# Patient Record
Sex: Male | Born: 1970 | Race: White | Hispanic: No | Marital: Married | Smoking: Never smoker
Health system: Southern US, Community
[De-identification: ages and names within clinical notes are randomized; demographics above are authoritative.]

## PROBLEM LIST (undated history)

## (undated) DIAGNOSIS — K219 Gastro-esophageal reflux disease without esophagitis: Secondary | ICD-10-CM

## (undated) DIAGNOSIS — K8020 Calculus of gallbladder without cholecystitis without obstruction: Secondary | ICD-10-CM

## (undated) DIAGNOSIS — F32A Depression, unspecified: Secondary | ICD-10-CM

## (undated) DIAGNOSIS — F329 Major depressive disorder, single episode, unspecified: Secondary | ICD-10-CM

## (undated) DIAGNOSIS — F419 Anxiety disorder, unspecified: Secondary | ICD-10-CM

## (undated) DIAGNOSIS — T7840XA Allergy, unspecified, initial encounter: Secondary | ICD-10-CM

## (undated) HISTORY — DX: Gastro-esophageal reflux disease without esophagitis: K21.9

## (undated) HISTORY — PX: OTHER SURGICAL HISTORY: SHX169

## (undated) HISTORY — DX: Allergy, unspecified, initial encounter: T78.40XA

## (undated) HISTORY — PX: WISDOM TOOTH EXTRACTION: SHX21

---

## 2013-05-19 ENCOUNTER — Emergency Department (HOSPITAL_BASED_OUTPATIENT_CLINIC_OR_DEPARTMENT_OTHER)
Admission: EM | Admit: 2013-05-19 | Discharge: 2013-05-20 | Disposition: A | Discharge status: Home / Self Care | Payer: BC Managed Care – PPO | Attending: Emergency Medicine | Admitting: Emergency Medicine

## 2013-05-19 ENCOUNTER — Encounter (HOSPITAL_BASED_OUTPATIENT_CLINIC_OR_DEPARTMENT_OTHER): Payer: Self-pay | Admitting: Emergency Medicine

## 2013-05-19 DIAGNOSIS — K8020 Calculus of gallbladder without cholecystitis without obstruction: Secondary | ICD-10-CM

## 2013-05-19 DIAGNOSIS — K8070 Calculus of gallbladder and bile duct without cholecystitis without obstruction: Secondary | ICD-10-CM | POA: Insufficient documentation

## 2013-05-19 DIAGNOSIS — F3289 Other specified depressive episodes: Secondary | ICD-10-CM | POA: Insufficient documentation

## 2013-05-19 DIAGNOSIS — Z880 Allergy status to penicillin: Secondary | ICD-10-CM | POA: Insufficient documentation

## 2013-05-19 DIAGNOSIS — Z79899 Other long term (current) drug therapy: Secondary | ICD-10-CM | POA: Insufficient documentation

## 2013-05-19 DIAGNOSIS — F411 Generalized anxiety disorder: Secondary | ICD-10-CM | POA: Insufficient documentation

## 2013-05-19 DIAGNOSIS — R1033 Periumbilical pain: Secondary | ICD-10-CM | POA: Insufficient documentation

## 2013-05-19 DIAGNOSIS — F329 Major depressive disorder, single episode, unspecified: Secondary | ICD-10-CM | POA: Insufficient documentation

## 2013-05-19 HISTORY — DX: Major depressive disorder, single episode, unspecified: F32.9

## 2013-05-19 HISTORY — DX: Anxiety disorder, unspecified: F41.9

## 2013-05-19 HISTORY — DX: Depression, unspecified: F32.A

## 2013-05-19 LAB — URINALYSIS, ROUTINE W REFLEX MICROSCOPIC
BILIRUBIN URINE: NEGATIVE
GLUCOSE, UA: NEGATIVE mg/dL
Hgb urine dipstick: NEGATIVE
KETONES UR: NEGATIVE mg/dL
LEUKOCYTES UA: NEGATIVE
Nitrite: NEGATIVE
PROTEIN: NEGATIVE mg/dL
Specific Gravity, Urine: 1.016 (ref 1.005–1.030)
Urobilinogen, UA: 0.2 mg/dL (ref 0.0–1.0)
pH: 8 (ref 5.0–8.0)

## 2013-05-19 MED ORDER — ONDANSETRON HCL 4 MG/2ML IJ SOLN
4.0000 mg | Freq: Once | INTRAMUSCULAR | Status: AC
  Administered 2013-05-19: 4 mg via INTRAVENOUS
  Filled 2013-05-19: qty 2

## 2013-05-19 MED ORDER — SODIUM CHLORIDE 0.9 % IV BOLUS (SEPSIS)
1000.0000 mL | Freq: Once | INTRAVENOUS | Status: AC
  Administered 2013-05-19: 1000 mL via INTRAVENOUS

## 2013-05-19 MED ORDER — FENTANYL CITRATE 0.05 MG/ML IJ SOLN
50.0000 ug | Freq: Once | INTRAMUSCULAR | Status: AC
  Administered 2013-05-19: 50 ug via INTRAVENOUS
  Filled 2013-05-19: qty 2

## 2013-05-19 NOTE — ED Notes (Signed)
RLQ pain with vomiting x2 hours.

## 2013-05-19 NOTE — ED Notes (Signed)
Pt reports doesn't have regular bowel movements. PT reports blood in stool x 1 month but just associated it with hemorrhoids.  Pt complains of nausea and severe right lower quad pain.  Pt reports feeling as though his stomach is swollen.

## 2013-05-19 NOTE — ED Notes (Signed)
Rt abd pain  Sudden onset 2 hrs ago w vomiting  Also states urine has been cloudy for some time, bright red blood in stools

## 2013-05-20 ENCOUNTER — Emergency Department (HOSPITAL_BASED_OUTPATIENT_CLINIC_OR_DEPARTMENT_OTHER): Payer: BC Managed Care – PPO

## 2013-05-20 LAB — COMPREHENSIVE METABOLIC PANEL
ALBUMIN: 4.2 g/dL (ref 3.5–5.2)
ALK PHOS: 79 U/L (ref 39–117)
ALT: 20 U/L (ref 0–53)
AST: 20 U/L (ref 0–37)
BUN: 14 mg/dL (ref 6–23)
CALCIUM: 10.3 mg/dL (ref 8.4–10.5)
CO2: 25 mEq/L (ref 19–32)
CREATININE: 1.2 mg/dL (ref 0.50–1.35)
Chloride: 99 mEq/L (ref 96–112)
GFR calc non Af Amer: 74 mL/min — ABNORMAL LOW (ref >90)
GFR, EST AFRICAN AMERICAN: 85 mL/min — AB (ref >90)
Glucose, Bld: 108 mg/dL — ABNORMAL HIGH (ref 70–99)
Potassium: 3.6 mEq/L — ABNORMAL LOW (ref 3.7–5.3)
Sodium: 140 mEq/L (ref 137–147)
TOTAL PROTEIN: 7.8 g/dL (ref 6.0–8.3)
Total Bilirubin: 0.7 mg/dL (ref 0.3–1.2)

## 2013-05-20 LAB — CBC
HEMATOCRIT: 44.2 % (ref 39.0–52.0)
Hemoglobin: 15.8 g/dL (ref 13.0–17.0)
MCH: 31.1 pg (ref 26.0–34.0)
MCHC: 35.7 g/dL (ref 30.0–36.0)
MCV: 87 fL (ref 78.0–100.0)
Platelets: 315 10*3/uL (ref 150–400)
RBC: 5.08 MIL/uL (ref 4.22–5.81)
RDW: 11.8 % (ref 11.5–15.5)
WBC: 9.4 10*3/uL (ref 4.0–10.5)

## 2013-05-20 LAB — OCCULT BLOOD X 1 CARD TO LAB, STOOL: FECAL OCCULT BLD: NEGATIVE

## 2013-05-20 MED ORDER — ONDANSETRON HCL 4 MG/2ML IJ SOLN
4.0000 mg | Freq: Once | INTRAMUSCULAR | Status: AC
  Administered 2013-05-20: 4 mg via INTRAVENOUS
  Filled 2013-05-20: qty 2

## 2013-05-20 MED ORDER — HYDROMORPHONE HCL PF 1 MG/ML IJ SOLN
1.0000 mg | Freq: Once | INTRAMUSCULAR | Status: AC
  Administered 2013-05-20: 1 mg via INTRAVENOUS
  Filled 2013-05-20: qty 1

## 2013-05-20 MED ORDER — ONDANSETRON HCL 4 MG PO TABS: 4.0000 mg | ORAL_TABLET | Freq: Four times a day (QID) | ORAL | Status: DC

## 2013-05-20 MED ORDER — SODIUM CHLORIDE 0.9 % IV BOLUS (SEPSIS)
1000.0000 mL | Freq: Once | INTRAVENOUS | Status: AC
  Administered 2013-05-20: 1000 mL via INTRAVENOUS

## 2013-05-20 MED ORDER — IOHEXOL 300 MG/ML  SOLN
100.0000 mL | Freq: Once | INTRAMUSCULAR | Status: AC | PRN
  Administered 2013-05-20: 100 mL via INTRAVENOUS

## 2013-05-20 MED ORDER — IOHEXOL 300 MG/ML  SOLN
50.0000 mL | Freq: Once | INTRAMUSCULAR | Status: AC | PRN
  Administered 2013-05-20: 50 mL via ORAL

## 2013-05-20 MED ORDER — OXYCODONE-ACETAMINOPHEN 5-325 MG PO TABS: 2.0000 | ORAL_TABLET | ORAL | Status: DC | PRN

## 2013-05-20 NOTE — ED Provider Notes (Signed)
CSN: [id number]     Arrival date & time 05/19/13  2310 History   First MD Initiated Contact with Patient 05/20/13 0001     Chief Complaint  Patient presents with  . Abdominal Pain     (Consider location/radiation/quality/duration/timing/severity/associated sxs/prior Treatment) HPI 43 year old male presents to emergency room with onset of right upper quadrant and periumbilical pain starting at 9 PM.  Pain has been severe, sharp and unrelenting.  He has had nausea and vomiting associate with pain.  Number history of similar pain.  No prior abdominal surgeries.  Patient reports he has had bloody bowel movements.  Over the last month.  Patient seemed he had a hemorrhoid that was causing the bleeding.  Patient also reports over the last several weeks to months.  He has had dysuria, urinary frequency.  No fevers no chills Past Medical History  Diagnosis Date  . Depression   . Anxiety    History reviewed. No pertinent past surgical history. No family history on file. History  Substance Use Topics  . Smoking status: Never Smoker   . Smokeless tobacco: Not on file  . Alcohol Use: No    Review of Systems   See History of Present Illness; otherwise all other systems are reviewed and negative  Allergies  Amoxicillin and Penicillins  Home Medications   Current Outpatient Rx  Name  Route  Sig  Dispense  Refill  . amphetamine-dextroamphetamine (ADDERALL) 20 MG tablet   Oral   Take 20 mg by mouth daily.         . busPIRone (BUSPAR) 10 MG tablet   Oral   Take 10 mg by mouth 3 (three) times daily.         Marland Kitchen FLUoxetine (PROZAC) 20 MG tablet   Oral   Take 20 mg by mouth daily.          BP 159/94  Pulse 82  Temp(Src) 98.7 F (37.1 C) (Oral)  Resp 20  Ht 6' (1.829 m)  Wt 172 lb (78.019 kg)  BMI 23.32 kg/m2  SpO2 100% Physical Exam  Nursing note and vitals reviewed. Constitutional: He is oriented to person, place, and time. He appears well-developed and well-nourished. He  appears distressed.  HENT:  Head: Normocephalic and atraumatic.  Nose: Nose normal.  Mouth/Throat: Oropharynx is clear and moist.  Eyes: Conjunctivae and EOM are normal. Pupils are equal, round, and reactive to light.  Neck: Normal range of motion. Neck supple. No JVD present. No tracheal deviation present. No thyromegaly present.  Cardiovascular: Normal rate, regular rhythm, normal heart sounds and intact distal pulses.  Exam reveals no gallop and no friction rub.   No murmur heard. Pulmonary/Chest: Effort normal and breath sounds normal. No stridor. No respiratory distress. He has no wheezes. He has no rales. He exhibits no tenderness.  Abdominal: Soft. Bowel sounds are normal. He exhibits no distension and no mass. There is tenderness (patient with tenderness in right upper quadrant, moderate Murphy sign). There is no rebound and no guarding.  Genitourinary:  Patient has enlarged tender prostate on exam.  No hemorrhoids noted  Musculoskeletal: Normal range of motion. He exhibits no edema and no tenderness.  Lymphadenopathy:    He has no cervical adenopathy.  Neurological: He is alert and oriented to person, place, and time. He exhibits normal muscle tone. Coordination normal.  Skin: Skin is warm and dry. No rash noted. No erythema. No pallor.  Psychiatric: He has a normal mood and affect. His behavior is normal. Judgment  and thought content normal.    ED Course  Procedures (including critical care time) Labs Review Labs Reviewed  URINALYSIS, ROUTINE W REFLEX MICROSCOPIC - Abnormal; Notable for the following:    APPearance CLOUDY (*)    All other components within normal limits  COMPREHENSIVE METABOLIC PANEL - Abnormal; Notable for the following:    Potassium 3.6 (*)    Glucose, Bld 108 (*)    GFR calc non Af Amer 74 (*)    GFR calc Af Amer 85 (*)    All other components within normal limits  CBC  OCCULT BLOOD X 1 CARD TO LAB, STOOL   Imaging Review Ct Abdomen Pelvis W  Contrast  05/20/2013   CLINICAL DATA:  Epigastric and right upper quadrant abdominal pain.  EXAM: CT ABDOMEN AND PELVIS WITH CONTRAST  TECHNIQUE: Multidetector CT imaging of the abdomen and pelvis was performed using the standard protocol following bolus administration of intravenous contrast.  CONTRAST:  50mL OMNIPAQUE IOHEXOL 300 MG/ML SOLN, 100mL OMNIPAQUE IOHEXOL 300 MG/ML SOLN  COMPARISON:  No priors.  FINDINGS: Lung Bases: Unremarkable.  Abdomen/Pelvis: The appearance of the liver, pancreas, spleen, bilateral adrenal glands and bilateral kidneys is unremarkable. Several tiny high attenuation foci lying dependently in the gallbladder compatible with partially calcified gallstones. No current findings to suggest acute cholecystitis at this time. Normal appendix. No significant volume of ascites. No pneumoperitoneum. No pathologic distention of small bowel. No definite lymphadenopathy identified within the abdomen or pelvis on today's examination. Prostate gland and urinary bladder are unremarkable in appearance. Status post vasectomy.  Musculoskeletal: There are no aggressive appearing lytic or blastic lesions noted in the visualized portions of the skeleton.  IMPRESSION: 1. No acute findings in the abdomen or pelvis to account for the patient's symptoms. 2. Normal appendix. 3. Cholelithiasis without evidence to suggest acute cholecystitis at this time.   Electronically Signed   By: Trudie Reedaniel  Entrikin M.D.   On: 05/20/2013 02:14     EKG Interpretation None      MDM   Final diagnoses:  Cholelithiasis    43 year old male with right upper oral pain concerning for gallbladder pathology.  Patient also with odd history of blood in stool for the last month that he has not sought care for also with some dysuria.  On exam, he has a enlarged prostate with tenderness, may have an element of prostatitis ongoing.  Occult blood stool, negative.  Plan for CT abdomen pelvis with contrast to further evaluate for  colitis, cholecystitis, cholelithiasis.    Olivia Mackielga M Elspeth Blucher, MD 05/20/13 [phone]

## 2013-05-20 NOTE — Discharge Instructions (Signed)
Biliary Colic  °Biliary colic is a steady or irregular pain in the upper abdomen. It is usually under the right side of the rib cage. It happens when gallstones interfere with the normal flow of bile from the gallbladder. Bile is a liquid that helps to digest fats. Bile is made in the liver and stored in the gallbladder. When you eat a meal, bile passes from the gallbladder through the cystic duct and the common bile duct into the small intestine. There, it mixes with partially digested food. If a gallstone blocks either of these ducts, the normal flow of bile is blocked. The muscle cells in the bile duct contract forcefully to try to move the stone. This causes the pain of biliary colic.  °SYMPTOMS  °· A person with biliary colic usually complains of pain in the upper abdomen. This pain can be: °· In the center of the upper abdomen just below the breastbone. °· In the upper-right part of the abdomen, near the gallbladder and liver. °· Spread back toward the right shoulder blade. °· Nausea and vomiting. °· The pain usually occurs after eating. °· Biliary colic is usually triggered by the digestive system's demand for bile. The demand for bile is high after fatty meals. Symptoms can also occur when a person who has been fasting suddenly eats a very large meal. Most episodes of biliary colic pass after 1 to 5 hours. After the most intense pain passes, your abdomen may continue to ache mildly for about 24 hours. °DIAGNOSIS  °After you describe your symptoms, your caregiver will perform a physical exam. He or she will pay attention to the upper right portion of your belly (abdomen). This is the area of your liver and gallbladder. An ultrasound will help your caregiver look for gallstones. Specialized scans of the gallbladder may also be done. Blood tests may be done, especially if you have fever or if your pain persists. °PREVENTION  °Biliary colic can be prevented by controlling the risk factors for gallstones. Some of  these risk factors, such as heredity, increasing age, and pregnancy are a normal part of life. Obesity and a high-fat diet are risk factors you can change through a healthy lifestyle. Women going through menopause who take hormone replacement therapy (estrogen) are also more likely to develop biliary colic. °TREATMENT  °· Pain medication may be prescribed. °· You may be encouraged to eat a fat-free diet. °· If the first episode of biliary colic is severe, or episodes of colic keep retuning, surgery to remove the gallbladder (cholecystectomy) is usually recommended. This procedure can be done through small incisions using an instrument called a laparoscope. The procedure often requires a brief stay in the hospital. Some people can leave the hospital the same day. It is the most widely used treatment in people troubled by painful gallstones. It is effective and safe, with no complications in more than 90% of cases. °· If surgery cannot be done, medication that dissolves gallstones may be used. This medication is expensive and can take months or years to work. Only small stones will dissolve. °· Rarely, medication to dissolve gallstones is combined with a procedure called shock-wave lithotripsy. This procedure uses carefully aimed shock waves to break up gallstones. In many people treated with this procedure, gallstones form again within a few years. °PROGNOSIS  °If gallstones block your cystic duct or common bile duct, you are at risk for repeated episodes of biliary colic. There is also a 25% chance that you will develop   a gallbladder infection(acute cholecystitis), or some other complication of gallstones within 10 to 20 years. If you have surgery, schedule it at a time that is convenient for you and at a time when you are not sick. HOME CARE INSTRUCTIONS   Drink plenty of clear fluids.  Avoid fatty, greasy or fried foods, or any foods that make your pain worse.  Take medications as directed. SEEK MEDICAL  CARE IF:   You develop a fever over 100.5 F (38.1 C).  Your pain gets worse over time.  You develop nausea that prevents you from eating and drinking.  You develop vomiting. SEEK IMMEDIATE MEDICAL CARE IF:   You have continuous or severe belly (abdominal) pain which is not relieved with medications.  You develop nausea and vomiting which is not relieved with medications.  You have symptoms of biliary colic and you suddenly develop a fever and shaking chills. This may signal cholecystitis. Call your caregiver immediately.  You develop a yellow color to your skin or the white part of your eyes (jaundice). Document Released: 07/15/2005 Document Revised: 05/06/2011 Document Reviewed: 09/24/2007 [facility] Patient Information 2014 [place], [place].   Gallbladder  Take medications as prescribed.  Call the surgery clinic later today to schedule a follow up visit for your gallstones.  Return to the ER for worsening pain, vomiting despite medications, fever, or other new concerning symptoms.   PAIN ACETAMINOPHEN OXYCODONE  PAIN ACETAMINOPHEN OXYCODONE: You have been given a medication that contains acetaminophen and oxycodone.      This medication is used to relieve pain.     DO NOT take this medication if you have liver disease or drink alcohol on a daily basis.     DO NOT take this medication if you are taking other over-the-counter medications that contain Tylenol or acetaminophen (the active ingredient in Tylenol).     If you have side-effects that you think are caused by this medicine, tell your doctor.     DO NOT drink alcoholic beverages while taking this medicine.     If you become dizzy, sit or lie down at the first signs.  You should be careful going up and down stairs.     If you are pregnant or breastfeeding, notify your doctor before taking this medication.     Keep this medication out of the reach of children.  Always keep this medication in child-proof containers.   DO NOT give your medication to anyone else. This medication can be HABIT-FORMING.  Discontinue use when no longer needed and never give this medication to others.  You have been given a medication, or a prescription for a medication, that causes drowsiness or dizziness.  DO NOT drive a car, operate machinery, or perform jobs that require you to be alert until you know how you are going to react to this medicine.  THESE INSTRUCTIONS ARE NOT COMPREHENSIVE (complete):  Ask your pharmacist for additional information and precautions for this medication.   GI ANTIEMETIC  GI ANTIEMETIC: You have been given a prescription for a medication for nausea and vomiting.      It is OK to take this medication if you are pregnant.  Be sure to tell your regular doctor or obstetrician [facility] doctor) that you have been taking this medication.     Take this medication as directed.     If you are taking phenobarbital, narcotic pain medications, antidepressants, or sleeping pills your dosage may need to be adjusted.  Be sure to inform your doctor  of all the other medications that you are taking.     DO NOT take this medication if you have liver disease or heart disease.     DO NOT take pain killers (narcotic medication) unless specifically instructed to do so by your doctor     DO NOT drink alcoholic beverages while taking this medicine.     If you develop any reactions that you believe may be from the medication be sure to tell your doctor or return to the ER (Some reactions may include:  dizziness, shaking, visual disturbances, nervousness, fainting, rash).     If you become dizzy, sit or lie down at the first signs.  You should be careful going up and down stairs.     Keep this medication out of the reach of children.  Always keep this medication in child-proof containers.  DO NOT give your medication to anyone else. You have been given a medication, or a prescription for a medication, that causes drowsiness  or dizziness.  DO NOT drive a car, operate machinery, ride a bike, or perform jobs that require you to be alert until you know how you are going to react to this medication.  THESE INSTRUCTIONS ARE NOT COMPREHENSIVE (complete):  Ask your pharmacist for additional information and precautions for this medication.   LOW FAT DIET  Low Fat Diet  Your doctor wants you to be on a low fat diet.  This diet will be helpful if you want to lose weight or if you have problems with your liver, pancreas, gallbladder.  FOODS ALLOWED:    Beverages: All, except those not allowed.   Evaporated skim milk.     Breads: All enriched or whole grain bread, bread sticks, graham crackers, melba toast, pretzels, rye wafers, matzoh, saltines, bagels.     Cereals: All cooked without fat or dry.     Desserts: All fruit, diet puddings, gelatin, dessert made with egg white, angel food cake, fruit ice, sherbet.     Eggs: Not more than one egg yolk daily, whites okay.  Cholesterol free egg substitutes, such as "egg beaters."     Fat: One teaspoon each meal or 3 teaspoons per day; butter, mayonnaise, margarine, oil. (May be used in cooking if omitted at meals) Fat free salad dressings and gravy.     Fruits: All fresh, frozen, or canned fruit or fruit juice. One citrus fruit every day.     Meats, Fish, Poultry & Cheese: Remove visible fat from meat before cooking. Baked, broiled, boiled, roasted, stewed, simmered; lean fish, meat, poultry, seafood.  Water packed salmon and tuna.  Lowfat cottage cheese, skim milk cheese, ricotta, parmesan, farmer's cheese, lowfat yogurt and tofu.     Potatoes & Substitutes: Macaroni, noodles, rice, spaghetti, sweet or white potato; prepared without fat, unless used in amount allowed.     Soups: Bouillon, cream soups made with vegetables and skim milk, fat free meat and poultry soups.     Sweets: Honey, jam, jelly, marshmallows, molasses, sugar, syrup, candies; hard, Life Savers, gum  drops, jelly beans, sour balls.     Vegetables: All fresh, frozen, canned or juiced vegetables allowed.  FOODS NOT ALLOWED:    Beverages: Cream, 2% or whole milk, chocolate milk, condensed milk, evaporated or malted milk or shakes, 1/2 & 1/2.     Breads: Quick breads, muffins, biscuits, pancakes, corn bread, sweet rolls, any fried breads.     Cereals: Bran, if it causes distress, wheat germ.  Desserts: Dessert made with whole milk, cream, butter, lard, oil, coconut, nuts, or chocolate.     Eggs: Eggs prepared with whole milk or fat. Fried eggs.     Fat: More than 1 teaspoon per meal.  Bacon, bacon fat, ham fat, lard, salt pork, shortening, gravy, salad dressings, non-dairy creamers.     Fruits: [place].  Any fruit that causes distress.     Meats, Fish, [employer] forces [employer] and [employer]: All fried or fatty.  Sausage, prime rib, frankfurters, luncheon meats, fish canned in oil, duck, goose, poultry skin, spiced or pickled meats, cheese (except those allowed), whole milk yogurt.  Peanut butter limited to 1 Tbs. day.     Potatoes & Substitutes: Cooked with fat or oil, fried potatoes, potato chips, cream sauces (unless made with skim milk).     Sweets: Chocolate, coconut, nuts, caramels.                Vegetables: Avocado, any cooked in fat or that cause distress.    BILIARY COLIC  BILIARY COLIC: You have been diagnosed with biliary colic.  Biliary colic is the term used to describe crampy pain from a gallbladder that contains gallstones.  The gallbladder is a small sack that hangs from the liver. It stores a liquid called bile. Bile is produced by the liver. When you eat, the gallbladder squeezes bile through a duct or tube into the intestines to help with the digestion of fat.  Gallstones develop from crystals of bile. The stone may be smaller than a pea, or as large as a golf ball. There may be one or several stones. The stone may block the tube that drains the bile from the gallbladder. This  blockage causes spasm of the gallbladder and pain.  The pain usually comes and goes and is crampy.  It often starts after eating foods that contain a lot of fat.  You may also have nausea and vomiting with the pain.  Biliary colic is usually treated with pain medications. You may also be given a medication for the nausea and vomiting.  You should avoid eating foods that are fried or contain a lot of fat.  If you have more episodes of pain, you may need to have your gall bladder removed.      You should contact your family doctor for a referral to a general surgeon. YOU SHOULD SEEK MEDICAL ATTENTION IMMEDIATELY, EITHER HERE OR AT THE NEAREST EMERGENCY DEPARTMENT, IF ANY OF THE FOLLOWING OCCURS:      Increasing pain or pain that does not go away.     Persistent vomiting or if you are not able to keep any fluids down.     Fever or shaking chills.     Yellowing of your skin or eyes, or dark, brown-colored urine.  If you develop symptoms of Shortness of Breath, Chest Pain, Swelling of lips, mouth or tongue or if your condition becomes worse with any new symptoms, see your doctor or return to the Emergency Department for immediate care. Emergency services are not intended to be a substitute for comprehensive medical attention.  Please contact your doctor for follow up if not improving as expected.   Call your doctor in 5-7 days or as directed if there is no improvement.   Community Resources: *IF YOU ARE IN IMMEDIATE DANGER CALL 911!  Abuse/Neglect:  Family Services Crisis Hotline [facility]): 5855581581 [facility]): 313-468-0640  After hours, holidays and weekends: 901 278 9247 National Domestic Violence  Hotline: 800 811-9147  Mental Health: [facility]: Drucie Ip: [phone]  Health Clinics:  [facility]): [phone] Monday - Friday 8 AM - 9 PM, Saturday and Sunday 10 AM - 9  PM  [facility] [place]: [phone] Monday - Friday 8 AM - 5 PM  [facility]  [address]: [phone] Monday- Friday 8:30 AM - 5:30 PM, Sat 9 AM - 1 PM  [facility] [address]: [phone] [facility] [address]: [phone] [facility] [address]: [phone]  24 [facility]: [address] [phone]  Cultures: If culture results are positive, we will notify you if a change in treatment is necessary.  LABORATORY TESTS:         If you had any labs drawn in the ED that have not resulted by the time you are discharged home, we will review these lab results and the treatment given to you.  If there is any further treatment or notification needed, we will contact you by phone, or letter.  "PLEASE ENSURE THAT YOU HAVE GIVEN US YOUR CURRENT WORKING PHONE NUMBER AND YOUR CURRENT ADDRESS, so that we can contact you if needed."  RADIOLOGY TESTS:  If the referred physician wants todays x-rays, please call the hospitals Radiology Department the day before your doctors appointment. [facility]     [phone] [place]   [phone] [place]     [phone]  Our doctors and staff appreciate your choosing [place] for your emergency medical care needs. We are here to serve you.

## 2013-06-02 ENCOUNTER — Encounter (HOSPITAL_COMMUNITY): Payer: Self-pay | Admitting: *Deleted

## 2013-06-02 ENCOUNTER — Ambulatory Visit (INDEPENDENT_AMBULATORY_CARE_PROVIDER_SITE_OTHER): Payer: BC Managed Care – PPO | Admitting: Surgery

## 2013-06-02 ENCOUNTER — Encounter (INDEPENDENT_AMBULATORY_CARE_PROVIDER_SITE_OTHER): Payer: Self-pay | Admitting: Surgery

## 2013-06-02 VITALS — BP 128/74 | HR 75 | Temp 97.5°F | Ht 72.0 in | Wt 168.4 lb

## 2013-06-02 DIAGNOSIS — K8010 Calculus of gallbladder with chronic cholecystitis without obstruction: Secondary | ICD-10-CM | POA: Insufficient documentation

## 2013-06-02 NOTE — Progress Notes (Signed)
General Surgery [facility]  Chief Complaint  Patient presents with  . New Evaluation    evaluate for symptomatic cholelithiasis - referral from Dr. 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 Patient had his first episode of right upper quadrant abdominal pain associated with nausea and emesis in March 2015. He was seen in the emergency department. Laboratory studies were normal. CT scan of the abdomen and pelvis showed cholelithiasis. Patient has continued to have intermittent right upper quadrant abdominal pain, particularly with fatty food intake.  Patient denies any previous history of hepatobiliary or pancreatic disease. He denies jaundice or acholic stools. He has had no prior abdominal surgery.  Past Medical History  Diagnosis Date  . Depression   . Anxiety     Current Outpatient Prescriptions  Medication Sig Dispense Refill  . amphetamine-dextroamphetamine (ADDERALL) 20 MG tablet Take 20 mg by mouth daily.      . busPIRone (BUSPAR) 10 MG tablet Take 10 mg by mouth 3 (three) times daily.      Marland Kitchen FLUoxetine (PROZAC) 20 MG tablet Take 20 mg by mouth daily.      . ondansetron (ZOFRAN) 4 MG tablet Take 1 tablet (4 mg total) by mouth every 6 (six) hours.  20 tablet  0   No current facility-administered medications for this visit.    Allergies  Allergen Reactions  . Amoxicillin Rash  . Penicillins Rash    History reviewed. No pertinent family history.  History   Social History  . Marital Status: Married    Spouse Name: N/A    Number of Children: N/A  . Years of Education: N/A   Social History Main Topics  . Smoking status: Never Smoker   . Smokeless tobacco: None  . Alcohol Use: Yes  . Drug Use: No  . Sexual Activity: None   Other Topics Concern  . None   Social History Narrative  . None    REVIEW OF SYSTEMS - PERTINENT POSITIVES  ONLY: Denies jaundice. Denies acholic stools. Intermittent right upper quadrant abdominal pain. Intermittent nausea. Occasional emesis.  EXAM: Filed Vitals:   06/02/13 1004  BP: 128/74  Pulse: 75  Temp: 97.5 F (36.4 C)    GENERAL: well-developed, well-nourished, no acute distress HEENT: normocephalic; pupils equal and reactive; sclerae clear; dentition good; mucous membranes moist NECK:  No palpable masses in the thyroid bed; symmetric on extension; no palpable anterior or posterior cervical lymphadenopathy; no supraclavicular masses; no tenderness CHEST: clear to auscultation bilaterally without rales, rhonchi, or wheezes CARDIAC: regular rate and rhythm without significant murmur; peripheral pulses are full ABDOMEN: soft without distension; bowel sounds present; no mass; no hepatosplenomegaly; small umbilical hernia; mild tenderness to deep palpation right upper quadrant without palpable mass EXT:  non-tender without edema; no deformity NEURO: no gross focal deficits; no sign of tremor   LABORATORY RESULTS: See [facility] (CHL-Epic) for most recent results  RADIOLOGY RESULTS: See [facility] (CHL-Epic) for most recent results  IMPRESSION: Symptomatic cholelithiasis, probable chronic cholecystitis  PLAN: I discussed the above findings at length with the patient. We reviewed his CT scan results and his laboratory studies. I provided him with written literature regarding cholecystectomy.  We discussed laparoscopic cholecystectomy. We discussed the procedure. We discussed potential complications. We discussed intraoperative cholangiography. We discussed the hospital stay to be anticipated and is returned activities. Patient understands and wishes to proceed in the near future.  The risks and benefits of the procedure have been discussed at length with the patient.  The patient understands the proposed procedure, potential alternative treatments, and the course of recovery  to be expected.  All of the patient's questions have been answered at this time.  The patient wishes to proceed with surgery.  Velora Hecklerodd M. Tyjae Shvartsman, MD, FACS General & Endocrine Surgery [facility]  Primary Care Physician: No PCP Per Patient

## 2013-06-02 NOTE — Patient Instructions (Signed)
  [facility]  LAPAROSCOPIC SURGERY - POST-OP INSTRUCTIONS  Always review your discharge instruction sheet given to you by the facility where your surgery was performed.  A prescription for pain medication may be given to you upon discharge.  Take your pain medication as prescribed.  If narcotic pain medicine is not needed, then you may take acetaminophen (Tylenol) or ibuprofen (Advil) as needed.  Take your usually prescribed medications unless otherwise directed.  If you need a refill on your pain medication, please contact your pharmacy.  They will contact our office to request authorization. Prescriptions will not be filled after 5 P.M. or on weekends.  You should follow a light diet the first few days after arrival home, such as soup and crackers or toast.  Be sure to include plenty of fluids daily.  Most patients will experience some swelling and bruising in the area of the incisions.  Ice packs will help.  Swelling and bruising can take several days to resolve.   It is common to experience some constipation if taking pain medication after surgery.  Increasing fluid intake and taking a stool softener (such as Colace) will usually help or prevent this problem from occurring.  A mild laxative (Milk of Magnesia or Miralax) should be taken according to package instructions if there are no bowel movements after 48 hours.  Unless discharge instructions indicate otherwise, you may remove your bandages 24-48 hours after surgery, and you may shower at that time.  You may have steri-strips (small skin tapes) in place directly over the incision.  These strips should be left on the skin for 7-10 days.  If your surgeon used skin glue on the incision, you may shower in 24 hours.  The glue will flake off over the next 2-3 weeks.  Any sutures or staples will be removed at the office during your follow-up visit.  ACTIVITIES:  You may resume regular (light) daily activities beginning the  next day-such as daily self-care, walking, climbing stairs-gradually increasing activities as tolerated.  You may have sexual intercourse when it is comfortable.  Refrain from any heavy lifting or straining until approved by your doctor.  You may drive when you are no longer taking prescription pain medication, you can comfortably wear a seatbelt, and you can safely maneuver your car and apply brakes.  You should see your doctor in the office for a follow-up appointment approximately 2-3 weeks after your surgery.  Make sure that you call for this appointment within a day or two after you arrive home to insure a convenient appointment time.  WHEN TO CALL YOUR DOCTOR: 1. Fever over 101.0 2. Inability to urinate 3. Continued bleeding from incision 4. Increased pain, redness, or drainage from the incision 5. Increasing abdominal pain  The clinic staff is available to answer your questions during regular business hours.  Please don't hesitate to call and ask to speak to one of the nurses for clinical concerns.  If you have a medical emergency, go to the nearest emergency room or call 911.  A surgeon from [facility] is always on call for the hospital.  Ashleigh Luckow M. Rhyatt Muska, MD, FACS [facility] Office: [phone] Toll Free:  [phone] [phone]  Web site: [email or web address] 

## 2013-06-02 NOTE — Progress Notes (Signed)
SAME DAY SURGERY INSTRUCTIONS AND CHLORHEXIDINE INSTRUCTIONS / PRECAUTIONS REVIEWED WITH PT BY PHONE.  PT HAS LABS IN EPIC CBC, CMET, UA -FROM 05/19/13 AND 05/20/13 - NO OTHER LABS NEEDED PER ANESTHESIOLOGIST'S GUIDELINES.

## 2013-06-04 ENCOUNTER — Encounter (HOSPITAL_COMMUNITY): Payer: Self-pay | Admitting: Registered Nurse

## 2013-06-04 ENCOUNTER — Encounter (HOSPITAL_COMMUNITY)
Admission: RE | Disposition: A | Discharge status: Home / Self Care | Payer: Self-pay | Source: Ambulatory Visit | Attending: Surgery

## 2013-06-04 ENCOUNTER — Ambulatory Visit (HOSPITAL_COMMUNITY): Payer: BC Managed Care – PPO | Admitting: Registered Nurse

## 2013-06-04 ENCOUNTER — Observation Stay (HOSPITAL_COMMUNITY)
Admission: RE | Admit: 2013-06-04 | Discharge: 2013-06-05 | Disposition: A | Discharge status: Home / Self Care | Payer: BC Managed Care – PPO | Source: Ambulatory Visit | Attending: Surgery | Admitting: Surgery

## 2013-06-04 ENCOUNTER — Encounter (HOSPITAL_COMMUNITY): Payer: BC Managed Care – PPO | Admitting: Registered Nurse

## 2013-06-04 ENCOUNTER — Ambulatory Visit (HOSPITAL_COMMUNITY): Payer: BC Managed Care – PPO

## 2013-06-04 DIAGNOSIS — F3289 Other specified depressive episodes: Secondary | ICD-10-CM | POA: Insufficient documentation

## 2013-06-04 DIAGNOSIS — Z79899 Other long term (current) drug therapy: Secondary | ICD-10-CM | POA: Insufficient documentation

## 2013-06-04 DIAGNOSIS — F329 Major depressive disorder, single episode, unspecified: Secondary | ICD-10-CM | POA: Insufficient documentation

## 2013-06-04 DIAGNOSIS — F411 Generalized anxiety disorder: Secondary | ICD-10-CM | POA: Insufficient documentation

## 2013-06-04 DIAGNOSIS — K8010 Calculus of gallbladder with chronic cholecystitis without obstruction: Principal | ICD-10-CM | POA: Insufficient documentation

## 2013-06-04 HISTORY — PX: CHOLECYSTECTOMY: SHX55

## 2013-06-04 HISTORY — DX: Calculus of gallbladder without cholecystitis without obstruction: K80.20

## 2013-06-04 SURGERY — LAPAROSCOPIC CHOLECYSTECTOMY WITH INTRAOPERATIVE CHOLANGIOGRAM
Anesthesia: General

## 2013-06-04 MED ORDER — ACETAMINOPHEN 10 MG/ML IV SOLN
1000.0000 mg | Freq: Once | INTRAVENOUS | Status: AC
  Administered 2013-06-04: 1000 mg via INTRAVENOUS
  Filled 2013-06-04: qty 100

## 2013-06-04 MED ORDER — ONDANSETRON HCL 4 MG/2ML IJ SOLN
INTRAMUSCULAR | Status: AC
  Filled 2013-06-04: qty 2

## 2013-06-04 MED ORDER — DEXAMETHASONE SODIUM PHOSPHATE 10 MG/ML IJ SOLN
INTRAMUSCULAR | Status: AC
  Filled 2013-06-04: qty 1

## 2013-06-04 MED ORDER — KCL IN DEXTROSE-NACL 20-5-0.45 MEQ/L-%-% IV SOLN
INTRAVENOUS | Status: DC
  Administered 2013-06-04: 18:00:00 via INTRAVENOUS
  Filled 2013-06-04 (×2): qty 1000

## 2013-06-04 MED ORDER — BUPROPION HCL ER (XL) 150 MG PO TB24: 150.0000 mg | ORAL_TABLET | ORAL | Status: DC

## 2013-06-04 MED ORDER — BUPROPION HCL ER (XL) 150 MG PO TB24
150.0000 mg | ORAL_TABLET | Freq: Every day | ORAL | Status: DC
  Administered 2013-06-04 – 2013-06-05 (×2): 150 mg via ORAL
  Filled 2013-06-04 (×2): qty 1

## 2013-06-04 MED ORDER — SUCCINYLCHOLINE CHLORIDE 20 MG/ML IJ SOLN
INTRAMUSCULAR | Status: DC | PRN
Start: 2013-06-04 — End: 2013-06-04
  Administered 2013-06-04: 100 mg via INTRAVENOUS

## 2013-06-04 MED ORDER — CIPROFLOXACIN IN D5W 400 MG/200ML IV SOLN
INTRAVENOUS | Status: AC
  Filled 2013-06-04: qty 200

## 2013-06-04 MED ORDER — MIDAZOLAM HCL 2 MG/2ML IJ SOLN
INTRAMUSCULAR | Status: AC
  Filled 2013-06-04: qty 2

## 2013-06-04 MED ORDER — ACETAMINOPHEN 325 MG PO TABS: 650.0000 mg | ORAL_TABLET | ORAL | Status: DC | PRN

## 2013-06-04 MED ORDER — NEOSTIGMINE METHYLSULFATE 1 MG/ML IJ SOLN
INTRAMUSCULAR | Status: DC | PRN
  Administered 2013-06-04: 4 mg via INTRAVENOUS

## 2013-06-04 MED ORDER — ONDANSETRON HCL 4 MG/2ML IJ SOLN
INTRAMUSCULAR | Status: DC | PRN
  Administered 2013-06-04: 4 mg via INTRAVENOUS

## 2013-06-04 MED ORDER — SUCCINYLCHOLINE CHLORIDE 20 MG/ML IJ SOLN
INTRAMUSCULAR | Status: AC
  Filled 2013-06-04: qty 1

## 2013-06-04 MED ORDER — FLUOXETINE HCL 20 MG PO TABS
20.0000 mg | ORAL_TABLET | Freq: Every day | ORAL | Status: DC
  Administered 2013-06-04 – 2013-06-05 (×2): 20 mg via ORAL
  Filled 2013-06-04 (×2): qty 1

## 2013-06-04 MED ORDER — PHENYLEPHRINE HCL 10 MG/ML IJ SOLN
INTRAMUSCULAR | Status: DC | PRN
  Administered 2013-06-04: 80 ug via INTRAVENOUS

## 2013-06-04 MED ORDER — FENTANYL CITRATE 0.05 MG/ML IJ SOLN
INTRAMUSCULAR | Status: AC
  Filled 2013-06-04: qty 5

## 2013-06-04 MED ORDER — ACETAMINOPHEN 10 MG/ML IV SOLN: INTRAVENOUS | Status: DC | PRN

## 2013-06-04 MED ORDER — MIDAZOLAM HCL 5 MG/5ML IJ SOLN
INTRAMUSCULAR | Status: DC | PRN
  Administered 2013-06-04 (×2): 2 mg via INTRAVENOUS

## 2013-06-04 MED ORDER — HYDROCODONE-ACETAMINOPHEN 5-325 MG PO TABS: 1.0000 | ORAL_TABLET | ORAL | Status: DC | PRN

## 2013-06-04 MED ORDER — FENTANYL CITRATE 0.05 MG/ML IJ SOLN
INTRAMUSCULAR | Status: DC | PRN
  Administered 2013-06-04: 50 ug via INTRAVENOUS
  Administered 2013-06-04 (×2): 100 ug via INTRAVENOUS

## 2013-06-04 MED ORDER — LAMOTRIGINE 200 MG PO TABS
200.0000 mg | ORAL_TABLET | ORAL | Status: DC
  Administered 2013-06-04 – 2013-06-05 (×2): 200 mg via ORAL
  Filled 2013-06-04 (×3): qty 1

## 2013-06-04 MED ORDER — BUPIVACAINE-EPINEPHRINE PF 0.5-1:200000 % IJ SOLN
INTRAMUSCULAR | Status: AC
  Filled 2013-06-04: qty 30

## 2013-06-04 MED ORDER — HYDROMORPHONE HCL PF 1 MG/ML IJ SOLN
INTRAMUSCULAR | Status: DC | PRN
  Administered 2013-06-04: 0.5 mg via INTRAVENOUS
  Administered 2013-06-04: 1 mg via INTRAVENOUS
  Administered 2013-06-04: 0.5 mg via INTRAVENOUS

## 2013-06-04 MED ORDER — PROPOFOL 10 MG/ML IV BOLUS
INTRAVENOUS | Status: DC | PRN
  Administered 2013-06-04: 200 mg via INTRAVENOUS

## 2013-06-04 MED ORDER — IOHEXOL 300 MG/ML  SOLN
INTRAMUSCULAR | Status: DC | PRN
  Administered 2013-06-04: 19 mL via INTRAVENOUS

## 2013-06-04 MED ORDER — CIPROFLOXACIN IN D5W 400 MG/200ML IV SOLN
400.0000 mg | INTRAVENOUS | Status: AC
  Administered 2013-06-04: 400 mg via INTRAVENOUS

## 2013-06-04 MED ORDER — BUPROPION HCL ER (XL) 150 MG PO TB24
150.0000 mg | ORAL_TABLET | ORAL | Status: DC
  Filled 2013-06-04 (×2): qty 1

## 2013-06-04 MED ORDER — BUPIVACAINE-EPINEPHRINE 0.5% -1:200000 IJ SOLN
INTRAMUSCULAR | Status: DC | PRN
  Administered 2013-06-04: 20 mL

## 2013-06-04 MED ORDER — LACTATED RINGERS IV SOLN: INTRAVENOUS | Status: DC

## 2013-06-04 MED ORDER — ROCURONIUM BROMIDE 100 MG/10ML IV SOLN
INTRAVENOUS | Status: AC
  Filled 2013-06-04: qty 1

## 2013-06-04 MED ORDER — HYDROCODONE-ACETAMINOPHEN 5-325 MG PO TABS
1.0000 | ORAL_TABLET | ORAL | Status: DC | PRN
  Administered 2013-06-04 – 2013-06-05 (×3): 2 via ORAL
  Filled 2013-06-04 (×3): qty 2

## 2013-06-04 MED ORDER — LACTATED RINGERS IV SOLN
INTRAVENOUS | Status: DC | PRN
  Administered 2013-06-04: 250 mL via INTRAVENOUS

## 2013-06-04 MED ORDER — LIDOCAINE HCL (CARDIAC) 20 MG/ML IV SOLN
INTRAVENOUS | Status: DC | PRN
Start: 2013-06-04 — End: 2013-06-04
  Administered 2013-06-04: 75 mg via INTRAVENOUS
  Administered 2013-06-04: 25 mg via INTRAVENOUS

## 2013-06-04 MED ORDER — HYDROMORPHONE HCL PF 1 MG/ML IJ SOLN
1.0000 mg | INTRAMUSCULAR | Status: DC | PRN
  Administered 2013-06-04 (×2): 1 mg via INTRAVENOUS
  Filled 2013-06-04 (×2): qty 1

## 2013-06-04 MED ORDER — LACTATED RINGERS IV SOLN
INTRAVENOUS | Status: DC
  Administered 2013-06-04: 1000 mL via INTRAVENOUS
  Administered 2013-06-04: 14:00:00 via INTRAVENOUS

## 2013-06-04 MED ORDER — ONDANSETRON HCL 4 MG/2ML IJ SOLN
4.0000 mg | Freq: Four times a day (QID) | INTRAMUSCULAR | Status: DC | PRN
  Administered 2013-06-04: 4 mg via INTRAVENOUS
  Filled 2013-06-04 (×2): qty 2

## 2013-06-04 MED ORDER — DEXAMETHASONE SODIUM PHOSPHATE 10 MG/ML IJ SOLN
INTRAMUSCULAR | Status: DC | PRN
  Administered 2013-06-04: 10 mg via INTRAVENOUS

## 2013-06-04 MED ORDER — PROPOFOL 10 MG/ML IV BOLUS
INTRAVENOUS | Status: AC
  Filled 2013-06-04: qty 20

## 2013-06-04 MED ORDER — GLYCOPYRROLATE 0.2 MG/ML IJ SOLN
INTRAMUSCULAR | Status: DC | PRN
  Administered 2013-06-04: .8 mg via INTRAVENOUS

## 2013-06-04 MED ORDER — ONDANSETRON HCL 4 MG PO TABS: 4.0000 mg | ORAL_TABLET | Freq: Four times a day (QID) | ORAL | Status: DC | PRN

## 2013-06-04 MED ORDER — HYDROMORPHONE HCL PF 2 MG/ML IJ SOLN
INTRAMUSCULAR | Status: AC
  Filled 2013-06-04: qty 1

## 2013-06-04 MED ORDER — FENTANYL CITRATE 0.05 MG/ML IJ SOLN: 25.0000 ug | INTRAMUSCULAR | Status: DC | PRN

## 2013-06-04 MED ORDER — ROCURONIUM BROMIDE 100 MG/10ML IV SOLN
INTRAVENOUS | Status: DC | PRN
  Administered 2013-06-04: 45 mg via INTRAVENOUS
  Administered 2013-06-04: 5 mg via INTRAVENOUS

## 2013-06-04 MED ORDER — AMPHETAMINE-DEXTROAMPHETAMINE 10 MG PO TABS
20.0000 mg | ORAL_TABLET | Freq: Every day | ORAL | Status: DC
  Administered 2013-06-04 – 2013-06-05 (×2): 20 mg via ORAL
  Filled 2013-06-04 (×2): qty 2

## 2013-06-04 MED ORDER — LIDOCAINE HCL (CARDIAC) 20 MG/ML IV SOLN
INTRAVENOUS | Status: AC
  Filled 2013-06-04: qty 5

## 2013-06-04 SURGICAL SUPPLY — 37 items
APPLIER CLIP ROT 10 11.4 M/L (STAPLE) ×2
BENZOIN TINCTURE PRP APPL 2/3 (GAUZE/BANDAGES/DRESSINGS) ×2 IMPLANT
CABLE HIGH FREQUENCY MONO STRZ (ELECTRODE) ×2 IMPLANT
CANISTER SUCTION 2500CC (MISCELLANEOUS) ×2 IMPLANT
CHLORAPREP W/TINT 26ML (MISCELLANEOUS) ×2 IMPLANT
CLIP APPLIE ROT 10 11.4 M/L (STAPLE) ×1 IMPLANT
COVER MAYO STAND STRL (DRAPES) ×2 IMPLANT
DECANTER SPIKE VIAL GLASS SM (MISCELLANEOUS) ×2 IMPLANT
DRAPE C-ARM 42X120 X-RAY (DRAPES) ×2 IMPLANT
DRAPE LAPAROSCOPIC ABDOMINAL (DRAPES) ×2 IMPLANT
DRAPE UTILITY XL STRL (DRAPES) ×2 IMPLANT
ELECT REM PT RETURN 9FT ADLT (ELECTROSURGICAL) ×2
ELECTRODE REM PT RTRN 9FT ADLT (ELECTROSURGICAL) ×1 IMPLANT
GAUZE SPONGE 2X2 8PLY STRL LF (GAUZE/BANDAGES/DRESSINGS) ×1 IMPLANT
GLOVE SURG ORTHO 8.0 STRL STRW (GLOVE) ×2 IMPLANT
GOWN STRL REUS W/TWL XL LVL3 (GOWN DISPOSABLE) ×4 IMPLANT
HEMOSTAT SURGICEL 4X8 (HEMOSTASIS) IMPLANT
KIT BASIN OR (CUSTOM PROCEDURE TRAY) ×2 IMPLANT
NS IRRIG 1000ML POUR BTL (IV SOLUTION) ×2 IMPLANT
POUCH SPECIMEN RETRIEVAL 10MM (ENDOMECHANICALS) ×2 IMPLANT
SCISSORS LAP 5X35 DISP (ENDOMECHANICALS) ×2 IMPLANT
SET CHOLANGIOGRAPH MIX (MISCELLANEOUS) ×2 IMPLANT
SET IRRIG TUBING LAPAROSCOPIC (IRRIGATION / IRRIGATOR) ×2 IMPLANT
SLEEVE XCEL OPT CAN 5 100 (ENDOMECHANICALS) ×2 IMPLANT
SOLUTION ANTI FOG 6CC (MISCELLANEOUS) ×2 IMPLANT
SPONGE GAUZE 2X2 STER 10/PKG (GAUZE/BANDAGES/DRESSINGS) ×1
STRIP CLOSURE SKIN 1/2X4 (GAUZE/BANDAGES/DRESSINGS) ×2 IMPLANT
SUT MNCRL AB 4-0 PS2 18 (SUTURE) ×2 IMPLANT
TAPE CLOTH SOFT 2X10 (GAUZE/BANDAGES/DRESSINGS) ×2 IMPLANT
TAPE STRIPS DRAPE STRL (GAUZE/BANDAGES/DRESSINGS) ×2 IMPLANT
TOWEL OR 17X26 10 PK STRL BLUE (TOWEL DISPOSABLE) ×2 IMPLANT
TOWEL OR NON WOVEN STRL DISP B (DISPOSABLE) ×2 IMPLANT
TRAY LAP CHOLE (CUSTOM PROCEDURE TRAY) ×2 IMPLANT
TROCAR BLADELESS OPT 5 100 (ENDOMECHANICALS) ×2 IMPLANT
TROCAR XCEL BLUNT TIP 100MML (ENDOMECHANICALS) ×2 IMPLANT
TROCAR XCEL NON-BLD 11X100MML (ENDOMECHANICALS) ×2 IMPLANT
TUBING INSUFFLATION 10FT LAP (TUBING) ×2 IMPLANT

## 2013-06-04 NOTE — Op Note (Signed)
Procedure Note  Pre-operative Diagnosis:  Subacute cholecystitis, cholelithiasis  Post-operative Diagnosis:  same  Surgeon:  Brandon Hecklerodd M. Idamae Coccia, MD, FACS  Assistant:  none   Procedure:  Laparoscopic cholecystectomy with intra-operative cholangiography  Anesthesia:  General  Estimated Blood Loss:  minimal  Drains: none         Specimen: Gallbladder to pathology  Indications:  Patient is a 43 yo WM with intermittent acute abdominal pain.  CT scan shows cholelithiasis.  Now for cholecystectomy.  Procedure Details:  The patient was seen in the pre-op holding area. The risks, benefits, complications, treatment options, and expected outcomes have been discussed with the patient. The patient agreed with the proposed plan and signed the informed consent form.  The patient was taken to Operating Room, identified as Brandon Barnes and the procedure verified as Laparoscopic Cholecystectomy with Intraoperative Cholangiogram. A "time out" was completed and the above information confirmed.  Following induction of general anesthesia, the patient was placed in the supine position. The abdomen was prepped and draped in the usual aseptic fashion.  An incision was made in the skin below the umbilicus. The midline fascia was incised and the peritoneal cavity entered and the Hasson canula was introduced under direct vision.  The Hasson canula was secured with a 0-Vicryl pursestring suture. Pneumoperitoneum was established with carbon dioxide. Additional trocars were introduced under direct vision along the right costal margin in the midline, mid-clavicular line, and anterior axillary line.   The gallbladder was identified and the fundus grasped and retracted cephalad. Adhesions were taken down bluntly and the electrocautery was utilized as needed, taking care not to injure any adjacent structures. The infundibulum was grasped and retracted laterally, exposing the peritoneum overlying the triangle of Calot. The  peritoneum was incised and structures exposed with blunt dissection. The cystic duct was clearly identified, bluntly dissected circumferentially, and clipped at the neck of the gallbladder.  An incision was made in the cystic duct and the cholangiogram catheter introduced. The catheter was secured using an ligaclip.  Real-time cholangiography was performed using C-arm fluoroscopy.  There was rapid filling of a normal caliber common bile duct.  There was reflux of contrast into the left and right hepatic ductal systems.  There was free flow distally into the duodenum without filling defect or obstruction.  Catheter was removed from the peritoneal cavity.  The cystic duct was then triply ligated with surgical clips and divided. The cystic artery was identified, dissected circumferentially, ligated with ligaclips, and divided.  The gallbladder was dissected away from the liver bed using the electrocautery for hemostasis. The gallbladder was completely removed from the liver and placed into an endocatch bag. The right upper quadrant was irrigated and the gallbladder bed was inspected. Hemostasis was achieved with the electrocautery. Warm saline irrigation was utilized until clear.  Pneumoperitoneum was released after viewing removal of the trocars with good hemostasis noted. The umbilical wound was irrigated and the fascia was then closed with the pursestring suture.  Local anesthetic was infiltrated at all port sites. The skin incisions were closed with 4-0 Monocril subcuticular sutures and steri-strips and dressings were applied.  Instrument, sponge, and needle counts were correct at the conclusion of the case.  The patient was awakened from anesthesia and brought to the recovery room in stable condition.  The patient tolerated the procedure well.   Brandon Hecklerodd M. Sarit Sparano, MD, [facility] Office: [phone]

## 2013-06-04 NOTE — Transfer of Care (Signed)
Immediate Anesthesia Transfer of Care Note  Patient: Brandon Barnes  Procedure(s) Performed: Procedure(s): LAPAROSCOPIC CHOLECYSTECTOMY WITH INTRAOPERATIVE CHOLANGIOGRAM (N/A)  Patient Location: PACU  Anesthesia Type:General  Level of Consciousness: awake, alert  and oriented  Airway & Oxygen Therapy: Patient Spontanous Breathing and Patient connected to face mask oxygen  Post-op Assessment: Report given to PACU RN and Post -op Vital signs reviewed and stable  Post vital signs: Reviewed and stable  Complications: No apparent anesthesia complications

## 2013-06-04 NOTE — Anesthesia Postprocedure Evaluation (Signed)
  Anesthesia Post-op Note  Patient: Brandon Barnes  Procedure(s) Performed: Procedure(s) (LRB): LAPAROSCOPIC CHOLECYSTECTOMY WITH INTRAOPERATIVE CHOLANGIOGRAM (N/A)  Patient Location: PACU  Anesthesia Type: General  Level of Consciousness: awake and alert   Airway and Oxygen Therapy: Patient Spontanous Breathing  Post-op Pain: mild  Post-op Assessment: Post-op Vital signs reviewed, Patient's Cardiovascular Status Stable, Respiratory Function Stable, Patent Airway and No signs of Nausea or vomiting  Last Vitals:  Filed Vitals:   06/04/13 1600  BP: 151/89  Pulse: 91  Temp: 36.7 C  Resp: 14    Post-op Vital Signs: stable   Complications: No apparent anesthesia complications

## 2013-06-04 NOTE — Interval H&P Note (Signed)
History and Physical Interval Note:  06/04/2013 1:09 PM  Brandon Barnes  has presented today for surgery, with the diagnosis of cholelithiasis.  The various methods of treatment have been discussed with the patient and family. After consideration of risks, benefits and other options for treatment, the patient has consented to    Procedure(s): LAPAROSCOPIC CHOLECYSTECTOMY WITH INTRAOPERATIVE CHOLANGIOGRAM (N/A) as a surgical intervention .    The patient's history has been reviewed, patient examined, no change in status, stable for surgery.  I have reviewed the patient's chart and labs.  Questions were answered to the patient's satisfaction.    Velora Hecklerodd M. Suad Autrey, MD, [facility] Office: [phone]     Velora Hecklerodd M Saree Krogh

## 2013-06-04 NOTE — Anesthesia Preprocedure Evaluation (Addendum)
Anesthesia Evaluation  Patient identified by MRN, date of birth, ID band Patient awake    Reviewed: Allergy & Precautions, H&P , NPO status , Patient's Chart, lab work & pertinent test results  Airway Mallampati: II  TM Distance: >3 FB Neck ROM: full    Dental no notable dental hx. (+) Teeth Intact, Dental Advisory Given   Pulmonary neg pulmonary ROS,  breath sounds clear to auscultation  Pulmonary exam normal       Cardiovascular Exercise Tolerance: Good negative cardio ROS  Rhythm:regular Rate:Normal     Neuro/Psych negative neurological ROS  negative psych ROS   GI/Hepatic negative GI ROS, Neg liver ROS,   Endo/Other  negative endocrine ROS  Renal/GU negative Renal ROS  negative genitourinary   Musculoskeletal   Abdominal   Peds  Hematology negative hematology ROS (+)   Anesthesia Other Findings   Reproductive/Obstetrics negative OB ROS                            Anesthesia Physical Anesthesia Plan  ASA: I  Anesthesia Plan: General   Post-op Pain Management:    Induction: Intravenous  Airway Management Planned: Oral ETT  Additional Equipment:   Intra-op Plan:   Post-operative Plan: Extubation in OR  Informed Consent: I have reviewed the patients History and Physical, chart, labs and discussed the procedure including the risks, benefits and alternatives for the proposed anesthesia with the patient or authorized representative who has indicated his/her understanding and acceptance.   Dental Advisory Given  Plan Discussed with: CRNA and Surgeon  Anesthesia Plan Comments:         Anesthesia Quick Evaluation  

## 2013-06-04 NOTE — H&P (View-Only) (Signed)
General Surgery [facility]  Chief Complaint  Patient presents with  . New Evaluation    evaluate for symptomatic cholelithiasis - referral from Dr. 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 Patient had his first episode of right upper quadrant abdominal pain associated with nausea and emesis in March 2015. He was seen in the emergency department. Laboratory studies were normal. CT scan of the abdomen and pelvis showed cholelithiasis. Patient has continued to have intermittent right upper quadrant abdominal pain, particularly with fatty food intake.  Patient denies any previous history of hepatobiliary or pancreatic disease. He denies jaundice or acholic stools. He has had no prior abdominal surgery.  Past Medical History  Diagnosis Date  . Depression   . Anxiety     Current Outpatient Prescriptions  Medication Sig Dispense Refill  . amphetamine-dextroamphetamine (ADDERALL) 20 MG tablet Take 20 mg by mouth daily.      . busPIRone (BUSPAR) 10 MG tablet Take 10 mg by mouth 3 (three) times daily.      Marland Kitchen FLUoxetine (PROZAC) 20 MG tablet Take 20 mg by mouth daily.      . ondansetron (ZOFRAN) 4 MG tablet Take 1 tablet (4 mg total) by mouth every 6 (six) hours.  20 tablet  0   No current facility-administered medications for this visit.    Allergies  Allergen Reactions  . Amoxicillin Rash  . Penicillins Rash    History reviewed. No pertinent family history.  History   Social History  . Marital Status: Married    Spouse Name: N/A    Number of Children: N/A  . Years of Education: N/A   Social History Main Topics  . Smoking status: Never Smoker   . Smokeless tobacco: None  . Alcohol Use: Yes  . Drug Use: No  . Sexual Activity: None   Other Topics Concern  . None   Social History Narrative  . None    REVIEW OF SYSTEMS - PERTINENT POSITIVES  ONLY: Denies jaundice. Denies acholic stools. Intermittent right upper quadrant abdominal pain. Intermittent nausea. Occasional emesis.  EXAM: Filed Vitals:   06/02/13 1004  BP: 128/74  Pulse: 75  Temp: 97.5 F (36.4 C)    GENERAL: well-developed, well-nourished, no acute distress HEENT: normocephalic; pupils equal and reactive; sclerae clear; dentition good; mucous membranes moist NECK:  No palpable masses in the thyroid bed; symmetric on extension; no palpable anterior or posterior cervical lymphadenopathy; no supraclavicular masses; no tenderness CHEST: clear to auscultation bilaterally without rales, rhonchi, or wheezes CARDIAC: regular rate and rhythm without significant murmur; peripheral pulses are full ABDOMEN: soft without distension; bowel sounds present; no mass; no hepatosplenomegaly; small umbilical hernia; mild tenderness to deep palpation right upper quadrant without palpable mass EXT:  non-tender without edema; no deformity NEURO: no gross focal deficits; no sign of tremor   LABORATORY RESULTS: See [facility] (CHL-Epic) for most recent results  RADIOLOGY RESULTS: See [facility] (CHL-Epic) for most recent results  IMPRESSION: Symptomatic cholelithiasis, probable chronic cholecystitis  PLAN: I discussed the above findings at length with the patient. We reviewed his CT scan results and his laboratory studies. I provided him with written literature regarding cholecystectomy.  We discussed laparoscopic cholecystectomy. We discussed the procedure. We discussed potential complications. We discussed intraoperative cholangiography. We discussed the hospital stay to be anticipated and is returned activities. Patient understands and wishes to proceed in the near future.  The risks and benefits of the procedure have been discussed at length with the patient.  The patient understands the proposed procedure, potential alternative treatments, and the course of recovery  to be expected.  All of the patient's questions have been answered at this time.  The patient wishes to proceed with surgery.  Velora Hecklerodd M. Cal Gindlesperger, MD, FACS General & Endocrine Surgery [facility]  Primary Care Physician: No PCP Per Patient

## 2013-06-04 NOTE — Discharge Instructions (Signed)
  [facility]  LAPAROSCOPIC SURGERY - POST-OP INSTRUCTIONS  Always review your discharge instruction sheet given to you by the facility where your surgery was performed.  A prescription for pain medication may be given to you upon discharge.  Take your pain medication as prescribed.  If narcotic pain medicine is not needed, then you may take acetaminophen (Tylenol) or ibuprofen (Advil) as needed.  Take your usually prescribed medications unless otherwise directed.  If you need a refill on your pain medication, please contact your pharmacy.  They will contact our office to request authorization. Prescriptions will not be filled after 5 P.M. or on weekends.  You should follow a light diet the first few days after arrival home, such as soup and crackers or toast.  Be sure to include plenty of fluids daily.  Most patients will experience some swelling and bruising in the area of the incisions.  Ice packs will help.  Swelling and bruising can take several days to resolve.   It is common to experience some constipation if taking pain medication after surgery.  Increasing fluid intake and taking a stool softener (such as Colace) will usually help or prevent this problem from occurring.  A mild laxative (Milk of Magnesia or Miralax) should be taken according to package instructions if there are no bowel movements after 48 hours.  Unless discharge instructions indicate otherwise, you may remove your bandages 24-48 hours after surgery, and you may shower at that time.  You may have steri-strips (small skin tapes) in place directly over the incision.  These strips should be left on the skin for 7-10 days.  If your surgeon used skin glue on the incision, you may shower in 24 hours.  The glue will flake off over the next 2-3 weeks.  Any sutures or staples will be removed at the office during your follow-up visit.  ACTIVITIES:  You may resume regular (light) daily activities beginning the  next day-such as daily self-care, walking, climbing stairs-gradually increasing activities as tolerated.  You may have sexual intercourse when it is comfortable.  Refrain from any heavy lifting or straining until approved by your doctor.  You may drive when you are no longer taking prescription pain medication, you can comfortably wear a seatbelt, and you can safely maneuver your car and apply brakes.  You should see your doctor in the office for a follow-up appointment approximately 2-3 weeks after your surgery.  Make sure that you call for this appointment within a day or two after you arrive home to insure a convenient appointment time.  WHEN TO CALL YOUR DOCTOR: 1. Fever over 101.0 2. Inability to urinate 3. Continued bleeding from incision 4. Increased pain, redness, or drainage from the incision 5. Increasing abdominal pain  The clinic staff is available to answer your questions during regular business hours.  Please don't hesitate to call and ask to speak to one of the nurses for clinical concerns.  If you have a medical emergency, go to the nearest emergency room or call 911.  A surgeon from [facility] is always on call for the hospital.  Bert Ptacek M. Lorena Clearman, MD, [facility] Office: [phone] Toll Free:  [phone] FAX [phone]  Web site: [email or web address] 

## 2013-06-05 MED ORDER — HYDROCODONE-ACETAMINOPHEN 5-325 MG PO TABS: 1.0000 | ORAL_TABLET | ORAL | Status: DC | PRN

## 2013-06-05 NOTE — Progress Notes (Signed)
Patient D/C via wheelchair with family.

## 2013-06-05 NOTE — Discharge Summary (Signed)
  Physician Discharge Summary - [facility]  Patient ID: Brandon Barnes Behanna MRN: [id number] DOB/AGE: August 31, 1970 43 y.o.  Admit date: 06/04/2013 Discharge [facility]: [facility]  Admission Diagnoses:  Cholelithiasis, cholecystitis  Discharge Diagnoses:  Principal Problem:   Cholelithiasis with cholecystitis Active Problems:   Cholecystitis with cholelithiasis   Discharged Condition: good  Hospital Course: patient admitted for observation after lap chole with IOC.  Post op course stable.  Tolerating diet.  Pain controlled. Prepared for discharge on POD#1.  Consults: None  Significant Diagnostic Studies: none  Treatments: surgery: lap chole with IOC  Discharge Exam: Blood pressure 130/79, pulse 78, temperature 98.4 F (36.9 C), temperature source Oral, resp. rate 18, height 6' (1.829 m), weight 164 lb 8 oz (74.617 kg), SpO2 98.00%. HEENT - clear Neck - soft Chest - clear bilaterally Cor - RRR Abd - soft, mild distension; dressings dry and intact  Disposition: Home with family     Medication List    TAKE these medications       HYDROcodone-acetaminophen 5-325 MG per tablet  Commonly known as:  NORCO/VICODIN  Take 1-2 tablets by mouth every 4 (four) hours as needed.      ASK your doctor about these medications       amphetamine-dextroamphetamine 20 MG tablet  Commonly known as:  ADDERALL  Take 20 mg by mouth daily.     buPROPion 150 MG 24 hr tablet  Commonly known as:  WELLBUTRIN XL  Take 150 mg by mouth every morning.     FLUoxetine 20 MG tablet  Commonly known as:  PROZAC  Take 20 mg by mouth every morning.     ibuprofen 200 MG tablet  Commonly known as:  ADVIL,MOTRIN  Take 400 mg by mouth every 6 (six) hours as needed. As needed for gallbladder pain     lamoTRIgine 100 MG tablet  Commonly known as:  LAMICTAL  Take 200 mg by mouth every morning. Tabs po once a day in am     ondansetron 4 MG tablet  Commonly known as:  ZOFRAN  Take 1 tablet (4 mg  total) by mouth every 6 (six) hours.     oxyCODONE-acetaminophen 10-325 MG per tablet  Commonly known as:  PERCOCET  Take 1 tablet by mouth every 4 (four) hours as needed for pain (PAIN).           Follow-up Information   Follow up with Velora HecklerGERKIN,Kapena Hamme M, MD. Schedule an appointment as soon as possible for a visit in 3 weeks. (For wound check)    Specialty:  General Surgery   Contact information:   [address] [place] [address] [phone]       Velora Hecklerodd M. Levi Klaiber, MD, [facility] Office: [phone]   Signed: Huey Bienenstockodd M Ysidro Ramsay 06/05/2013, 8:36 AM

## 2013-06-07 ENCOUNTER — Encounter (HOSPITAL_COMMUNITY): Payer: Self-pay | Admitting: Surgery

## 2013-06-09 ENCOUNTER — Telehealth (INDEPENDENT_AMBULATORY_CARE_PROVIDER_SITE_OTHER): Payer: Self-pay

## 2013-06-09 NOTE — Telephone Encounter (Signed)
Pt back at work doing well. PO appt made for 06-29-13. Pt to call with concerns. Pt aware not to drive while taking pain med. Pt states he understands.

## 2013-06-29 ENCOUNTER — Ambulatory Visit (INDEPENDENT_AMBULATORY_CARE_PROVIDER_SITE_OTHER): Payer: BC Managed Care – PPO | Admitting: Surgery

## 2013-06-29 ENCOUNTER — Encounter (INDEPENDENT_AMBULATORY_CARE_PROVIDER_SITE_OTHER): Payer: Self-pay | Admitting: Surgery

## 2013-06-29 VITALS — BP 132/70 | HR 71 | Temp 97.4°F | Ht 72.0 in | Wt 166.0 lb

## 2013-06-29 DIAGNOSIS — K8010 Calculus of gallbladder with chronic cholecystitis without obstruction: Secondary | ICD-10-CM

## 2013-06-29 NOTE — Patient Instructions (Signed)
  CARE OF INCISION   Apply cocoa butter/vitamin E cream (Palmer's brand) to your incision 2 - 3 times daily.  Massage cream into incision for one minute with each application.  Use sunscreen (50 SPF or higher) for first 6 months after surgery if area is exposed to sun.  You may alternate Mederma or other scar reducing cream with cocoa butter cream if desired.       Mashawn Brazil M. Karne Ozga, MD, FACS      [facility]      Office: [phone]    

## 2013-06-29 NOTE — Progress Notes (Signed)
General Surgery [facility]  Chief Complaint  Patient presents with  . 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 Final pathology shows chronic cholecystitis and cholelithiasis. Postoperative course has been relatively uneventful. He has had no further abdominal pain. He is tolerating a regular diet. He is anxious to return to all of his normal activities.  EXAM: Abdomen is soft nontender without distention. Surgical wounds have healed nicely without sign of infection or herniation. Right upper quadrant is soft and nontender without mass.  IMPRESSION: Status post laparoscopic cholecystectomy  PLAN: Patient is released to full activity. I've asked him to refrain from strenuous lifting for a few more weeks. His diet is unrestricted.  Patient will begin applying topical creams to his incisions.  Patient will return for surgical care as needed.  Velora Hecklerodd M. Damont Balles, MD, FACS General & Endocrine Surgery [facility]   Visit Diagnoses: 1. Cholelithiasis with cholecystitis

## 2014-04-16 IMAGING — CT CT ABD-PELV W/ CM
2 of 5 series · 16 of 46 positions shown, 18 images · IV contrast (APPLIED)
Comparison: No priors.

CLINICAL DATA: Epigastric and right upper quadrant abdominal pain.

EXAM:
CT ABDOMEN AND PELVIS WITH CONTRAST
TECHNIQUE: Multidetector CT imaging of the abdomen and pelvis was performed
using the standard protocol following bolus administration of
intravenous contrast.
CONTRAST:  50mL OMNIPAQUE IOHEXOL 300 MG/ML SOLN, 100mL OMNIPAQUE
IOHEXOL 300 MG/ML SOLN

[Series 2: abd/pelvis 5.0 b31f · axial · 0.71mm/px · z∈[-378,+42]mm · 13 of 96 slices shown, 15 images]
[im 6/96  soft-tissue]
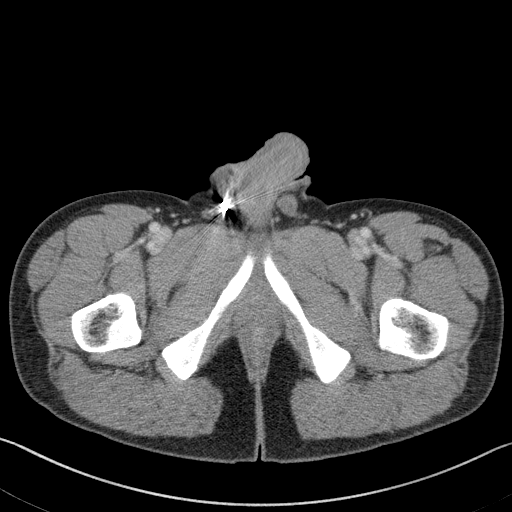
[im 6/96  bone]
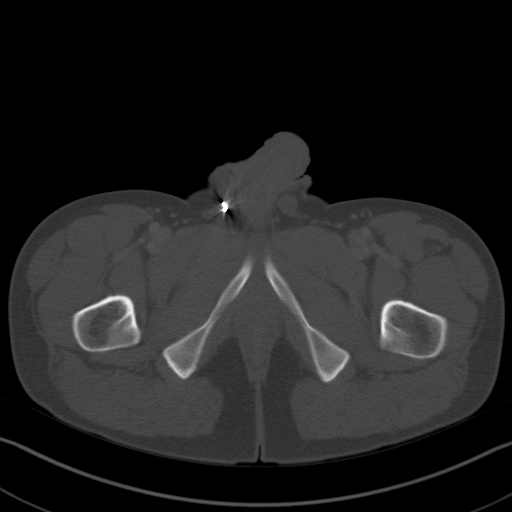
[im 11/96  soft-tissue]
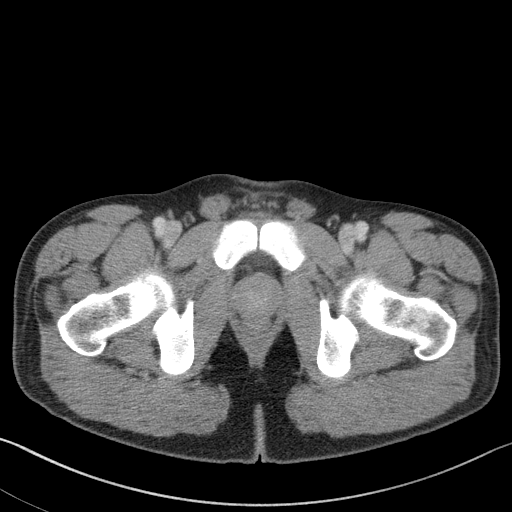
[im 22/96  soft-tissue]
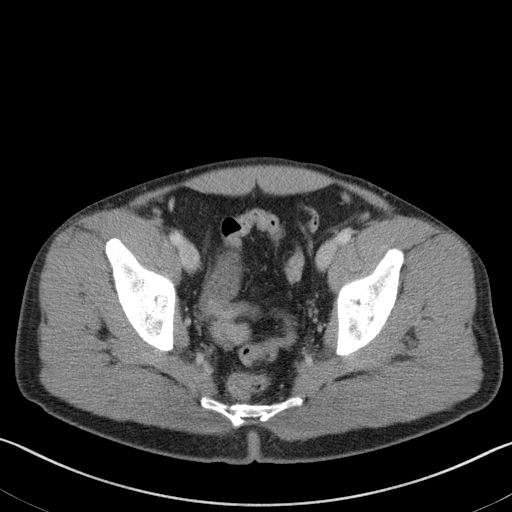
[im 27/96  soft-tissue]
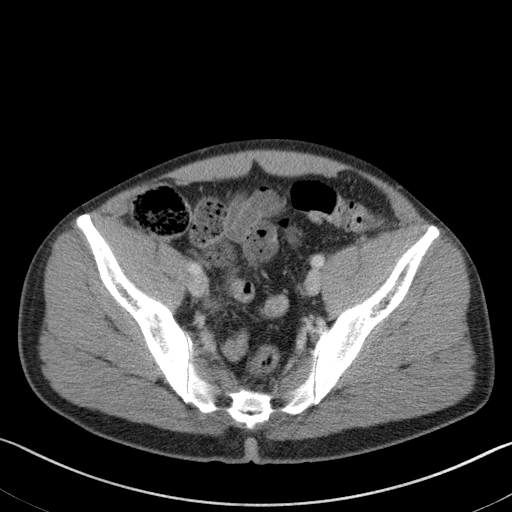
[im 32/96  soft-tissue]
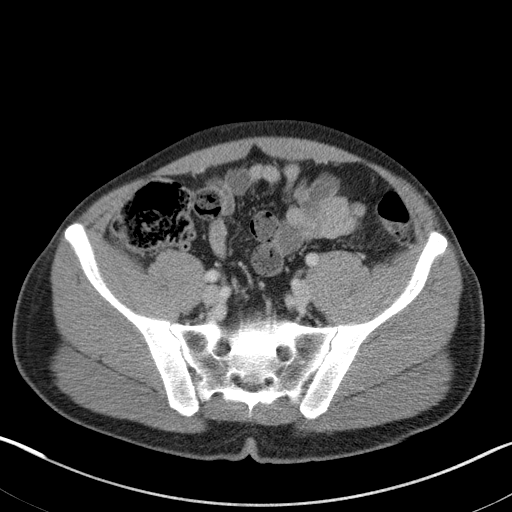
[im 43/96  soft-tissue]
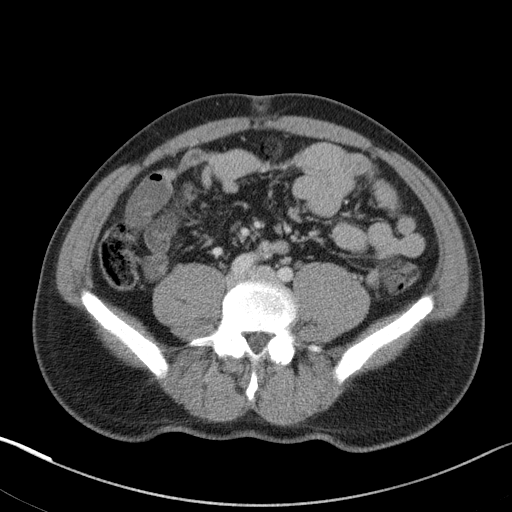
[im 48/96  soft-tissue]
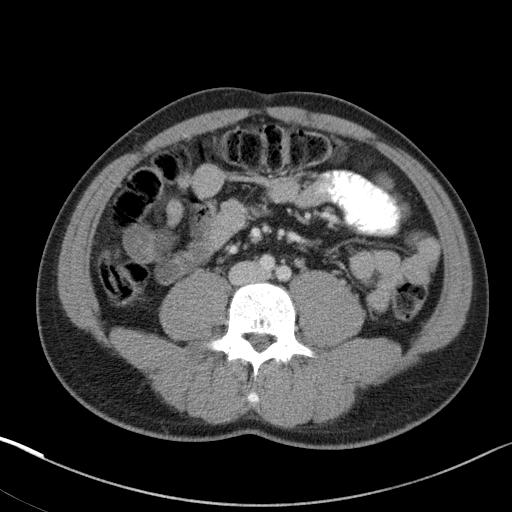
[im 53/96  soft-tissue]
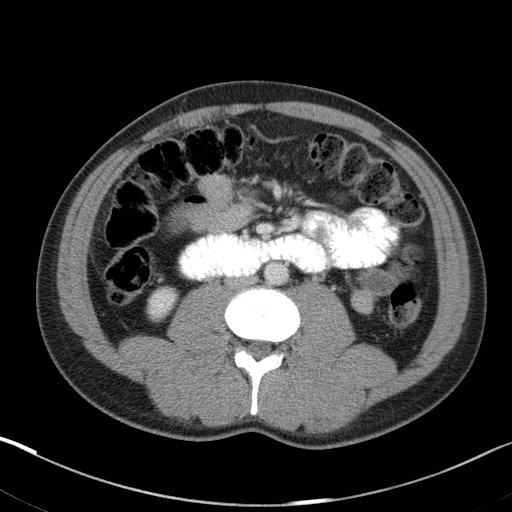
[im 64/96  soft-tissue]
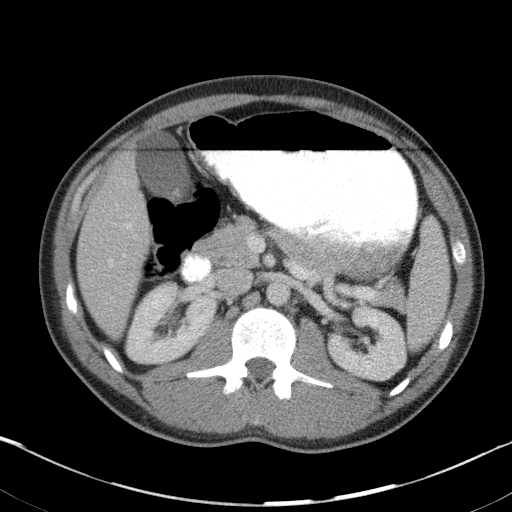
[im 64/96  bone]
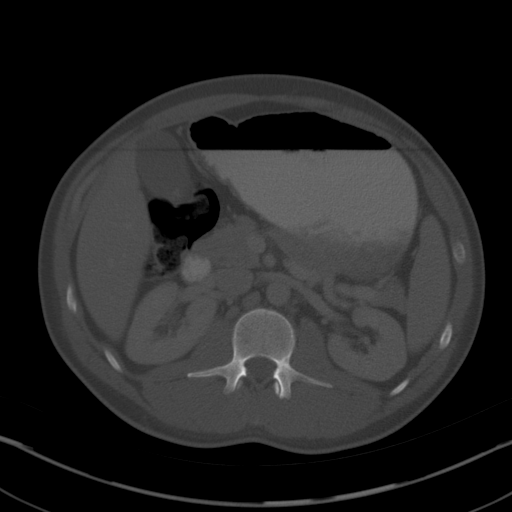
[im 69/96  soft-tissue]
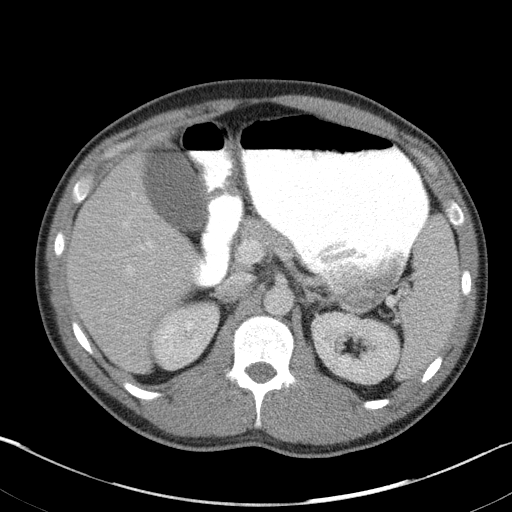
[im 74/96  soft-tissue]
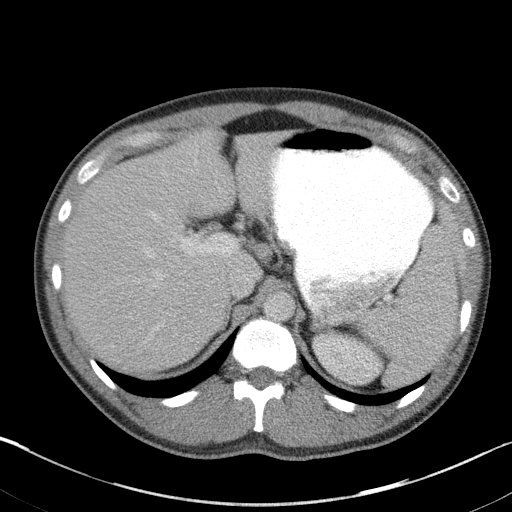
[im 85/96  soft-tissue]
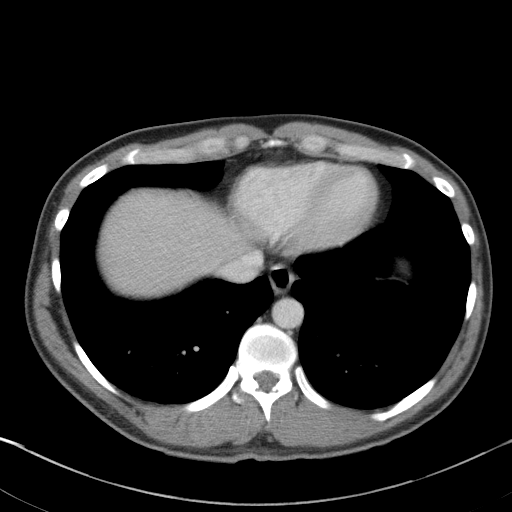
[im 90/96  soft-tissue]
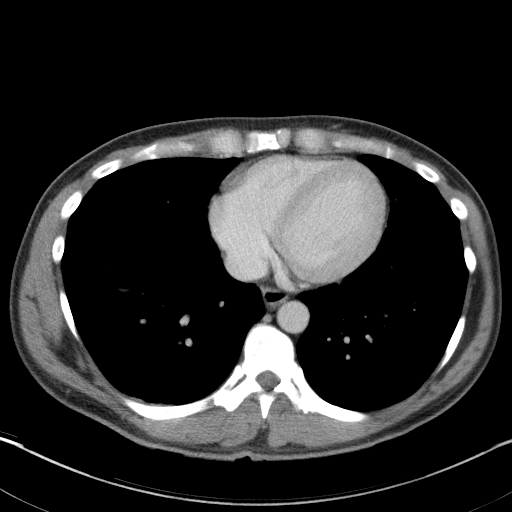

[Series 5: abd/pelvis 3.0 coronal · coronal · 0.79mm/px · 3 of 81 slices shown]
[im 27/81  soft-tissue]
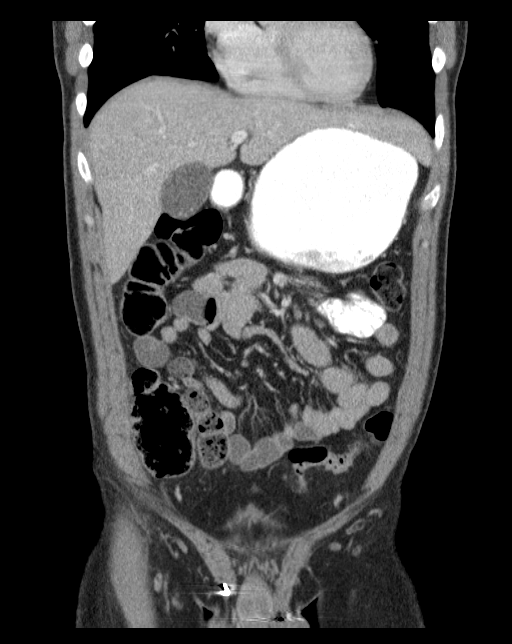
[im 36/81  soft-tissue]
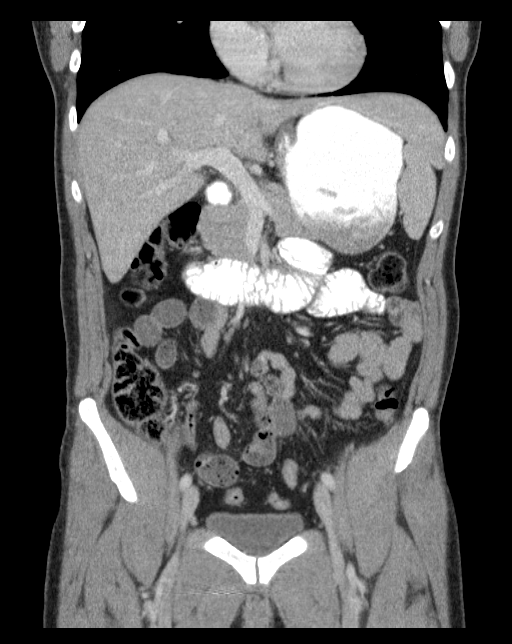
[im 45/81  soft-tissue]
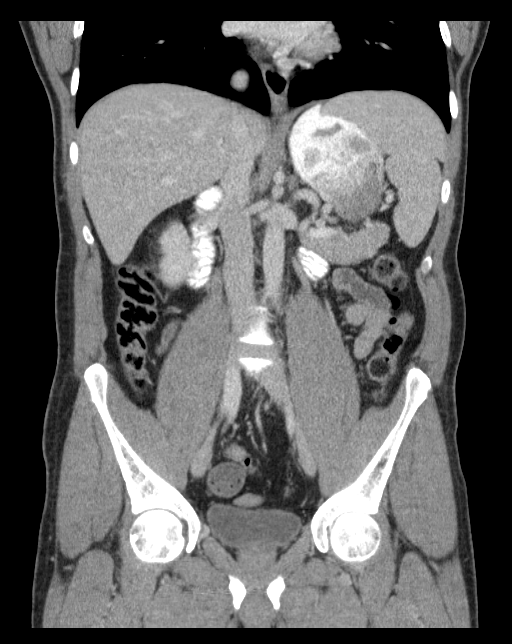

[16 of 46 positions shown; findings below may reference images not displayed]

FINDINGS: Lung Bases: Unremarkable.

Abdomen/Pelvis: The appearance of the liver, pancreas, spleen,
bilateral adrenal glands and bilateral kidneys is unremarkable.
Several tiny high attenuation foci lying dependently in the
gallbladder compatible with partially calcified gallstones. No
current findings to suggest acute cholecystitis at this time. Normal
appendix. No significant volume of ascites. No pneumoperitoneum. No
pathologic distention of small bowel. No definite lymphadenopathy
identified within the abdomen or pelvis on today's examination.
Prostate gland and urinary bladder are unremarkable in appearance.
Status post vasectomy.

Musculoskeletal: There are no aggressive appearing lytic or blastic
lesions noted in the visualized portions of the skeleton.
IMPRESSION: 1. No acute findings in the abdomen or pelvis to account for the
patient's symptoms.
2. Normal appendix.
3. Cholelithiasis without evidence to suggest acute cholecystitis at
this time.

## 2016-01-31 ENCOUNTER — Encounter: Payer: Self-pay | Admitting: Medical

## 2016-01-31 ENCOUNTER — Ambulatory Visit (INDEPENDENT_AMBULATORY_CARE_PROVIDER_SITE_OTHER): Payer: BLUE CROSS/BLUE SHIELD | Admitting: Medical

## 2016-01-31 VITALS — BP 116/80 | HR 87 | Temp 98.1°F | Ht 71.0 in | Wt 192.4 lb

## 2016-01-31 DIAGNOSIS — R05 Cough: Secondary | ICD-10-CM

## 2016-01-31 DIAGNOSIS — J0100 Acute maxillary sinusitis, unspecified: Secondary | ICD-10-CM | POA: Diagnosis not present

## 2016-01-31 DIAGNOSIS — J40 Bronchitis, not specified as acute or chronic: Secondary | ICD-10-CM | POA: Diagnosis not present

## 2016-01-31 DIAGNOSIS — R059 Cough, unspecified: Secondary | ICD-10-CM

## 2016-01-31 DIAGNOSIS — J309 Allergic rhinitis, unspecified: Secondary | ICD-10-CM

## 2016-01-31 MED ORDER — BENZONATATE 100 MG PO CAPS: 100.0000 mg | ORAL_CAPSULE | Freq: Three times a day (TID) | ORAL | 0 refills | Status: DC | PRN

## 2016-01-31 MED ORDER — DOXYCYCLINE HYCLATE 100 MG PO TABS: 100.0000 mg | ORAL_TABLET | Freq: Two times a day (BID) | ORAL | 0 refills | Status: DC

## 2016-01-31 MED ORDER — FLUTICASONE PROPIONATE 50 MCG/ACT NA SUSP: 2.0000 | Freq: Every day | NASAL | 3 refills | Status: DC

## 2016-01-31 NOTE — Patient Instructions (Addendum)
You appear to have bronchitis and sinusitis. Rest hydrate and tylenol for fever. I am prescribing cough medicine benzonatate , and doxycycline antibiotic. For your nasal congestion flonase nasal spray.  You should gradually get better. If not then notify us and would recommend a chest xray.  Follow up in 7-10 days or as needed  I would recommend you scheduling CPE in about 1-2 months.

## 2016-01-31 NOTE — Progress Notes (Signed)
Subjective:    Patient ID: Brandon Barnes, male    DOB: 1970-11-28, 45 y.o.   MRN: [id number]  HPI   Pt in for first time.  I have reviewed pt PMH, PSH, FH, Social History and Surgical History.  Pt works as [employer], Does not exercise regularly. Healthy diet moderate. Married- 3 children.  Pt has nasal and chest congestion. Symptoms for 2 weeks. Some faint blood tinged  Colored mucous. Pt taking dayquil and nyquil. Saturday had some chills and fever. No body aches reported. Pt has sinus pain/pressure. A lot of mucous drainage from his nose.   Pt has some seasonal allergy history. He takes claritin otc.   Review of Systems  Constitutional: Negative for chills, fatigue and fever.       None presnetly but some fever on weekends.  HENT: Positive for congestion, sinus pain and sinus pressure. Negative for ear pain, sneezing and trouble swallowing.   Respiratory: Positive for cough. Negative for chest tightness, shortness of breath and wheezing.   Cardiovascular: Negative for chest pain and palpitations.  Gastrointestinal: Negative for abdominal pain.  Musculoskeletal: Negative for back pain.  Neurological: Negative for dizziness and headaches.  Hematological: Negative for adenopathy. Does not bruise/bleed easily.  Psychiatric/Behavioral: Negative for behavioral problems, confusion, dysphoric mood and sleep disturbance. The patient is not nervous/anxious.     Past Medical History:  Diagnosis Date  . Anxiety   . Depression   . Gallstones    abdominal pain and nausea      Social History   Social History  . Marital status: Married    Spouse name: N/A  . Number of children: N/A  . Years of education: N/A   Occupational History  . Not on file.   Social History Main Topics  . Smoking status: Never Smoker  . Smokeless tobacco: Never Used  . Alcohol use Yes     Comment: maybe 2 beers a month  . Drug use: No  . Sexual activity: Not on file   Other  Topics Concern  . Not on file   Social History Narrative  . No narrative on file    Past Surgical History:  Procedure Laterality Date  . CHOLECYSTECTOMY N/A 06/04/2013   Procedure: LAPAROSCOPIC CHOLECYSTECTOMY WITH INTRAOPERATIVE CHOLANGIOGRAM;  Surgeon: Velora Hecklerodd M Gerkin, MD;  Location: [facility];  Service: General;  Laterality: N/A;  . vascectomy    . wisdom teeth extracted      No family history on file.  Allergies  Allergen Reactions  . Amoxicillin Rash  . Penicillins Rash    No current outpatient prescriptions on file prior to visit.   No current facility-administered medications on file prior to visit.     BP 116/80 (BP Location: Left Arm, Patient Position: Sitting, Cuff Size: Normal)   Pulse 87   Temp 98.1 F (36.7 C) (Oral)   Ht 5\' 11"  (1.803 m) Comment: without/ shoes  Wt 192 lb 6.4 oz (87.3 kg)   BMI 26.83 kg/m       Objective:   Physical Exam  General  Mental Status - Alert. General Appearance - Well groomed. Not in acute distress.  Skin Rashes- No Rashes.  HEENT Head- Normal. Ear Auditory Canal - Left- Normal. Right - Normal.Tympanic Membrane- Left- Normal. Right- Normal. Eye Sclera/Conjunctiva- Left- Normal. Right- Normal. Nose & Sinuses Nasal Mucosa- Left-  Boggy and Congested. Right-  Boggy and  Congested.Bilateral maxillary but  frontal sinus pressure. Mouth & Throat Lips: Upper Lip- Normal:  no dryness, cracking, pallor, cyanosis, or vesicular eruption. Lower Lip-Normal: no dryness, cracking, pallor, cyanosis or vesicular eruption. Buccal Mucosa- Bilateral- No Aphthous ulcers. Oropharynx- No Discharge or Erythema. +pnd Tonsils: Characteristics- Bilateral- No Erythema or Congestion. Size/Enlargement- Bilateral- No enlargement. Discharge- bilateral-None.  Neck Neck- Supple. No Masses.   Chest and Lung Exam Auscultation: Breath Sounds:-Clear even and unlabored.  Cardiovascular Auscultation:Rythm- Regular, rate and rhythm. Murmurs & Other  Heart Sounds:Ausculatation of the heart reveal- No Murmurs.  Lymphatic Head & Neck General Head & Neck Lymphatics: Bilateral: Description- No Localized lymphadenopathy.       Assessment & Plan:   You appear to have bronchitis and sinusitis. Rest hydrate and tylenol for fever. I am prescribing cough medicine benzonatate , and doxycycline antibiotic. For your nasal congestion flonase nasal spray.  You should gradually get better. If not then notify us and would recommend a chest xray.  Follow up in 7-10 days or as needed

## 2016-03-06 ENCOUNTER — Ambulatory Visit (INDEPENDENT_AMBULATORY_CARE_PROVIDER_SITE_OTHER): Payer: BLUE CROSS/BLUE SHIELD | Admitting: Medical

## 2016-03-06 ENCOUNTER — Encounter: Payer: Self-pay | Admitting: Medical

## 2016-03-06 VITALS — BP 117/75 | HR 60 | Temp 98.0°F | Resp 16 | Ht 71.0 in | Wt 189.2 lb

## 2016-03-06 DIAGNOSIS — F329 Major depressive disorder, single episode, unspecified: Secondary | ICD-10-CM

## 2016-03-06 DIAGNOSIS — Z0000 Encounter for general adult medical examination without abnormal findings: Secondary | ICD-10-CM | POA: Diagnosis not present

## 2016-03-06 DIAGNOSIS — Z113 Encounter for screening for infections with a predominantly sexual mode of transmission: Secondary | ICD-10-CM | POA: Diagnosis not present

## 2016-03-06 DIAGNOSIS — F32A Depression, unspecified: Secondary | ICD-10-CM

## 2016-03-06 LAB — COMPREHENSIVE METABOLIC PANEL
ALT: 34 U/L (ref 0–53)
AST: 21 U/L (ref 0–37)
Albumin: 4.3 g/dL (ref 3.5–5.2)
Alkaline Phosphatase: 53 U/L (ref 39–117)
BUN: 14 mg/dL (ref 6–23)
CO2: 29 mEq/L (ref 19–32)
Calcium: 9.6 mg/dL (ref 8.4–10.5)
Chloride: 105 mEq/L (ref 96–112)
Creatinine, Ser: 1.05 mg/dL (ref 0.40–1.50)
GFR: 81.43 mL/min (ref >60.00)
GLUCOSE: 83 mg/dL (ref 70–99)
POTASSIUM: 3.9 meq/L (ref 3.5–5.1)
SODIUM: 141 meq/L (ref 135–145)
TOTAL PROTEIN: 7.2 g/dL (ref 6.0–8.3)
Total Bilirubin: 1.5 mg/dL — ABNORMAL HIGH (ref 0.2–1.2)

## 2016-03-06 LAB — HIV ANTIBODY (ROUTINE TESTING W REFLEX): HIV 1&2 Ab, 4th Generation: NONREACTIVE

## 2016-03-06 LAB — CBC WITH DIFFERENTIAL/PLATELET
BASOS ABS: 0.1 10*3/uL (ref 0.0–0.1)
Basophils Relative: 1 % (ref 0.0–3.0)
Eosinophils Absolute: 0.4 10*3/uL (ref 0.0–0.7)
Eosinophils Relative: 5.8 % — ABNORMAL HIGH (ref 0.0–5.0)
HCT: 47 % (ref 39.0–52.0)
Hemoglobin: 16.4 g/dL (ref 13.0–17.0)
Lymphocytes Relative: 31.1 % (ref 12.0–46.0)
Lymphs Abs: 2.1 10*3/uL (ref 0.7–4.0)
MCHC: 34.8 g/dL (ref 30.0–36.0)
MCV: 89 fl (ref 78.0–100.0)
Monocytes Absolute: 0.4 10*3/uL (ref 0.1–1.0)
Monocytes Relative: 6.3 % (ref 3.0–12.0)
NEUTROS PCT: 55.8 % (ref 43.0–77.0)
Neutro Abs: 3.7 10*3/uL (ref 1.4–7.7)
Platelets: 246 10*3/uL (ref 150.0–400.0)
RBC: 5.28 Mil/uL (ref 4.22–5.81)
RDW: 12.2 % (ref 11.5–15.5)
WBC: 6.7 10*3/uL (ref 4.0–10.5)

## 2016-03-06 LAB — POC URINALSYSI DIPSTICK (AUTOMATED)
BILIRUBIN UA: NEGATIVE
GLUCOSE UA: NEGATIVE
KETONES UA: NEGATIVE
Leukocytes, UA: NEGATIVE
Nitrite, UA: NEGATIVE
Protein, UA: NEGATIVE
RBC UA: NEGATIVE
SPEC GRAV UA: 1.02
Urobilinogen, UA: NEGATIVE
pH, UA: 6.5

## 2016-03-06 LAB — LIPID PANEL
CHOL/HDL RATIO: 4
Cholesterol: 156 mg/dL (ref 0–200)
HDL: 40.3 mg/dL (ref >39.00)
LDL CALC: 100 mg/dL — AB (ref 0–99)
NonHDL: 116
Triglycerides: 79 mg/dL (ref 0.0–149.0)
VLDL: 15.8 mg/dL (ref 0.0–40.0)

## 2016-03-06 LAB — TSH: TSH: 2.63 u[IU]/mL (ref 0.35–4.50)

## 2016-03-06 MED ORDER — BUPROPION HCL ER (XL) 150 MG PO TB24: 150.0000 mg | ORAL_TABLET | Freq: Every day | ORAL | 0 refills | Status: DC

## 2016-03-06 MED ORDER — BUSPIRONE HCL 7.5 MG PO TABS: ORAL_TABLET | ORAL | 0 refills | Status: DC

## 2016-03-06 NOTE — Progress Notes (Signed)
Subjective:    Patient ID: Brandon Barnes, male    DOB: [birth year], 46 y.o.   MRN: [id number]  HPI  I have reviewed pt PMH, PSH, FH, Social History and Surgical History.  Pt is a Education officer, environmentalpastor at [employer], Pt states rare exercise, Pt admits not eating healthy. Married. 3 children. Non smoker and no alcohol.  Pt willing to get flu vaccine today.Ok's the vaccine. Pt up to date on tetanus vaccine.  Ok's hiv screen as well.  Pt also mentions that in past told maybe bipolar and maybe ADD. He questioned these dx. Had weight gain with med for bipola(He does not describe bipolar) He can't remember name of mediction. And anger issues when on ADD med/stimulant.) Pt does remember using wellbutrin in the past.   Pt states he has brief episodes of depressed mood and sometimes looses his tempter.   Review of Systems  Constitutional: Negative for chills and fatigue.  HENT: Negative for congestion, ear pain, postnasal drip, rhinorrhea, sore throat and tinnitus.   Respiratory: Negative for cough, chest tightness and wheezing.   Cardiovascular: Negative for chest pain and palpitations.  Gastrointestinal: Negative for abdominal pain, diarrhea, rectal pain and vomiting.  Endocrine: Negative for polydipsia, polyphagia and polyuria.  Genitourinary: Negative for decreased urine volume, difficulty urinating, discharge, flank pain, frequency, genital sores, hematuria, penile pain, scrotal swelling and testicular pain.  Musculoskeletal: Negative for back pain.  Skin: Negative for rash.  Neurological: Negative for dizziness, weakness and numbness.  Hematological: Negative for adenopathy. Does not bruise/bleed easily.  Psychiatric/Behavioral: Positive for dysphoric mood. Negative for behavioral problems, confusion, self-injury, sleep disturbance and suicidal ideas. The patient is not nervous/anxious and is not hyperactive.        Some anxiety and some loosing temper.    Past Medical History:  Diagnosis Date  . Allergy    . Anxiety   . Depression   . Gallstones    abdominal pain and nausea   . GERD (gastroesophageal reflux disease)    rare occasional.     Social History   Social History  . Marital status: Married    Spouse name: N/A  . Number of children: N/A  . Years of education: N/A   Occupational History  . Not on file.   Social History Main Topics  . Smoking status: Never Smoker  . Smokeless tobacco: Never Used  . Alcohol use Yes     Comment: maybe 2 beers a month  . Drug use: No  . Sexual activity: Yes   Other Topics Concern  . Not on file   Social History Narrative  . No narrative on file    Past Surgical History:  Procedure Laterality Date  . CHOLECYSTECTOMY N/A 06/04/2013   Procedure: LAPAROSCOPIC CHOLECYSTECTOMY WITH INTRAOPERATIVE CHOLANGIOGRAM;  Surgeon: Velora Hecklerodd M Gerkin, MD;  Location: [facility];  Service: General;  Laterality: N/A;  . vascectomy    . wisdom teeth extracted      No family history on file.  Allergies  Allergen Reactions  . Amoxicillin Rash  . Penicillins Rash    Current Outpatient Prescriptions on File Prior to Visit  Medication Sig Dispense Refill  . fluticasone (FLONASE) 50 MCG/ACT nasal spray Place 2 sprays into both nostrils daily. (Patient taking differently: Place 2 sprays into both nostrils daily as needed. ) 16 g 3   No current facility-administered medications on file prior to visit.     BP 117/75 (BP Location: Right Arm, Patient Position: Sitting, Cuff Size: Large)  Pulse 60   Temp 98 F (36.7 C) (Oral)   Resp 16   Ht 5\' 11"  (1.803 m)   Wt 189 lb 4 oz (85.8 kg)   SpO2 99%   BMI 26.40 kg/m       Objective:   Physical Exam   General Mental Status- Alert. General Appearance- Not in acute distress.   Skin General: Color- Normal Color. Moisture- Normal Moisture. On inspection no worrisome lesions(derm check 2 years ago and no concerns)  Neck Carotid Arteries- Normal color. Moisture- Normal Moisture. No carotid bruits. No  JVD.  Chest and Lung Exam Auscultation: Breath Sounds:-Normal.  Cardiovascular Auscultation:Rythm- Regular. Murmurs & Other Heart Sounds:Auscultation of the heart reveals- No Murmurs.  Abdomen Inspection:-Inspeection Normal. Palpation/Percussion:Note:No mass. Palpation and Percussion of the abdomen reveal- Non Tender, Non Distended + BS, no rebound or guarding.  Neurologic Cranial Nerve exam:- CN III-XII intact(No nystagmus), symmetric smile. Strength:- 5/5 equal and symmetric strength both upper and lower extremities.  Genital exam- no hernia on exam. Normal testes.      Assessment & Plan:  For you wellness exam today I have ordered cbc, cmp, tsh, lipid panel, ua and hiv.  Flu vaccine given today.   Recommend exercise and healthy diet.  We will let you know lab results as they come in.  Follow up date appointment will be determined after lab review.(But also update me how you are with mood in one month or sooner). Will determine if will give refills of wellbutrin and buspar or if you need appointment) Come in if needed as well.  Shritha Bresee, Ramon Dredge, PA-C

## 2016-03-06 NOTE — Progress Notes (Signed)
Pre visit review using our clinic review tool, if applicable. No additional management support is needed unless otherwise documented below in the visit note/SLS  

## 2016-03-06 NOTE — Patient Instructions (Addendum)
For you wellness exam today I have ordered cbc, cmp, tsh, lipid panel, ua and hiv.  Flu vaccine given today.   Recommend exercise and healthy diet.  We will let you know lab results as they come in.  Follow up date appointment will be determined after lab review.(But also update me how you are with mood in one month or sooner). Will determine if will give refills of wellbutrin and buspar or if you need appointment) Come in if needed as well.    Preventive Care 45-64 Years, Male Preventive care refers to lifestyle choices and visits with your health care provider that can promote health and wellness. What does preventive care include?  A yearly physical exam. This is also called an annual well check.  Dental exams once or twice a year.  Routine eye exams. Ask your health care provider how often you should have your eyes checked.  Personal lifestyle choices, including:  Daily care of your teeth and gums.  Regular physical activity.  Eating a healthy diet.  Avoiding tobacco and drug use.  Limiting alcohol use.  Practicing safe sex.  Taking low-dose aspirin every day starting at age 45. What happens during an annual well check? The services and screenings done by your health care provider during your annual well check will depend on your age, overall health, lifestyle risk factors, and family history of disease. Counseling  Your health care provider may ask you questions about your:  Alcohol use.  Tobacco use.  Drug use.  Emotional well-being.  Home and relationship well-being.  Sexual activity.  Eating habits.  Work and work Statistician. Screening  You may have the following tests or measurements:  Height, weight, and BMI.  Blood pressure.  Lipid and cholesterol levels. These may be checked every 5 years, or more frequently if you are over 27 years old.  Skin check.  Lung cancer screening. You may have this screening every year starting at age 45 if  you have a 30-pack-year history of smoking and currently smoke or have quit within the past 15 years.  Fecal occult blood test (FOBT) of the stool. You may have this test every year starting at age 45.  Flexible sigmoidoscopy or colonoscopy. You may have a sigmoidoscopy every 5 years or a colonoscopy every 10 years starting at age 45.  Prostate cancer screening. Recommendations will vary depending on your family history and other risks.  Hepatitis C blood test.  Hepatitis B blood test.  Sexually transmitted disease (STD) testing.  Diabetes screening. This is done by checking your blood sugar (glucose) after you have not eaten for a while (fasting). You may have this done every 1-3 years. Discuss your test results, treatment options, and if necessary, the need for more tests with your health care provider. Vaccines  Your health care provider may recommend certain vaccines, such as:  Influenza vaccine. This is recommended every year.  Tetanus, diphtheria, and acellular pertussis (Tdap, Td) vaccine. You may need a Td booster every 10 years.  Varicella vaccine. You may need this if you have not been vaccinated.  Zoster vaccine. You may need this after age 45.  Measles, mumps, and rubella (MMR) vaccine. You may need at least one dose of MMR if you were born in 1957 or later. You may also need a second dose.  Pneumococcal 13-valent conjugate (PCV13) vaccine. You may need this if you have certain conditions and have not been vaccinated.  Pneumococcal polysaccharide (PPSV23) vaccine. You may need one or  two doses if you smoke cigarettes or if you have certain conditions.  Meningococcal vaccine. You may need this if you have certain conditions.  Hepatitis A vaccine. You may need this if you have certain conditions or if you travel or work in places where you may be exposed to hepatitis A.  Hepatitis B vaccine. You may need this if you have certain conditions or if you travel or work in  places where you may be exposed to hepatitis B.  Haemophilus influenzae type b (Hib) vaccine. You may need this if you have certain risk factors. Talk to your health care provider about which screenings and vaccines you need and how often you need them. This information is not intended to replace advice given to you by your health care provider. Make sure you discuss any questions you have with your health care provider. Document Released: 03/10/2015 Document Revised: 11/01/2015 Document Reviewed: 12/13/2014 Elsevier Interactive Patient Education  2017 [employer].

## 2016-03-18 ENCOUNTER — Other Ambulatory Visit: Payer: Self-pay | Admitting: *Deleted

## 2016-03-18 DIAGNOSIS — R17 Unspecified jaundice: Secondary | ICD-10-CM

## 2016-04-02 ENCOUNTER — Other Ambulatory Visit: Payer: Self-pay | Admitting: Medical

## 2016-04-18 ENCOUNTER — Other Ambulatory Visit (INDEPENDENT_AMBULATORY_CARE_PROVIDER_SITE_OTHER): Payer: BLUE CROSS/BLUE SHIELD

## 2016-04-18 DIAGNOSIS — R17 Unspecified jaundice: Secondary | ICD-10-CM | POA: Diagnosis not present

## 2016-04-18 LAB — COMPREHENSIVE METABOLIC PANEL
ALT: 34 U/L (ref 0–53)
AST: 19 U/L (ref 0–37)
Albumin: 4.1 g/dL (ref 3.5–5.2)
Alkaline Phosphatase: 52 U/L (ref 39–117)
BILIRUBIN TOTAL: 1.7 mg/dL — AB (ref 0.2–1.2)
BUN: 15 mg/dL (ref 6–23)
CO2: 29 meq/L (ref 19–32)
Calcium: 9.3 mg/dL (ref 8.4–10.5)
Chloride: 106 mEq/L (ref 96–112)
Creatinine, Ser: 1.17 mg/dL (ref 0.40–1.50)
GFR: 71.83 mL/min (ref >60.00)
GLUCOSE: 103 mg/dL — AB (ref 70–99)
POTASSIUM: 3.8 meq/L (ref 3.5–5.1)
SODIUM: 140 meq/L (ref 135–145)
Total Protein: 6.7 g/dL (ref 6.0–8.3)

## 2016-04-20 ENCOUNTER — Telehealth: Payer: Self-pay | Admitting: Medical

## 2016-04-20 DIAGNOSIS — R17 Unspecified jaundice: Secondary | ICD-10-CM

## 2016-04-20 NOTE — Telephone Encounter (Signed)
Will you let pt know his bilirubin is elevated again and little more than last time. In past bilirubin was .7 in 2015 and now was 1.7. About a month ago was 1.5.   Bilirubin elevation can be benign elevated in some persons. Since his was low 3 years ago and now increased want GI opinion.   [facility] on [facility].

## 2016-04-20 NOTE — Telephone Encounter (Signed)
Referral to GI placed

## 2016-04-22 ENCOUNTER — Encounter: Payer: Self-pay | Admitting: Gastroenterology

## 2016-04-22 NOTE — Telephone Encounter (Signed)
Called and left a message for call back  

## 2016-05-09 ENCOUNTER — Other Ambulatory Visit: Payer: Self-pay | Admitting: Medical

## 2016-05-09 ENCOUNTER — Other Ambulatory Visit: Payer: Self-pay | Admitting: *Deleted

## 2016-05-09 MED ORDER — BUSPIRONE HCL 7.5 MG PO TABS
7.5000 mg | ORAL_TABLET | Freq: Two times a day (BID) | ORAL | 0 refills | Status: DC
Start: 2016-05-09 — End: 2016-08-02

## 2016-05-09 MED ORDER — BUSPIRONE HCL 7.5 MG PO TABS: 7.5000 mg | ORAL_TABLET | Freq: Two times a day (BID) | ORAL | 0 refills | Status: DC

## 2016-05-09 MED ORDER — BUPROPION HCL ER (XL) 150 MG PO TB24: 150.0000 mg | ORAL_TABLET | Freq: Every day | ORAL | 0 refills | Status: DC

## 2016-05-09 NOTE — Progress Notes (Signed)
Refill sent per [facility] refill protocol/SLS  

## 2016-05-22 ENCOUNTER — Other Ambulatory Visit: Payer: BLUE CROSS/BLUE SHIELD

## 2016-05-22 ENCOUNTER — Ambulatory Visit (INDEPENDENT_AMBULATORY_CARE_PROVIDER_SITE_OTHER): Payer: BLUE CROSS/BLUE SHIELD | Admitting: Gastroenterology

## 2016-05-22 ENCOUNTER — Encounter (INDEPENDENT_AMBULATORY_CARE_PROVIDER_SITE_OTHER): Payer: Self-pay

## 2016-05-22 ENCOUNTER — Encounter: Payer: Self-pay | Admitting: Gastroenterology

## 2016-05-22 VITALS — BP 126/80 | HR 76 | Ht 70.5 in | Wt 189.4 lb

## 2016-05-22 DIAGNOSIS — K625 Hemorrhage of anus and rectum: Secondary | ICD-10-CM | POA: Diagnosis not present

## 2016-05-22 DIAGNOSIS — R1310 Dysphagia, unspecified: Secondary | ICD-10-CM

## 2016-05-22 DIAGNOSIS — R17 Unspecified jaundice: Secondary | ICD-10-CM | POA: Diagnosis not present

## 2016-05-22 MED ORDER — NA SULFATE-K SULFATE-MG SULF 17.5-3.13-1.6 GM/177ML PO SOLN: 1.0000 | Freq: Once | ORAL | 0 refills | Status: AC

## 2016-05-22 NOTE — Progress Notes (Signed)
HPI: This is a  very pleasant 46 year old man  who was referred to me by Esperanza RichtersSaguier, Edward, PA-C  to evaluate  total bilirubin elevation .    Chief complaint is abnormal liver tests. He also mentioned dysphagia and minor rectal bleeding.  Old Data Reviewed:  Total bilirubin 1.03 April 2016, total bilirubin 1.01 March 2016. Otherwise liver tests were all completely normal.  Overall weight has been up 15 pounds in 4-5 months.  No significant abd pains.  He will have either diarrhea or constipation. For 2 years, he's seen red blood on tissue paper.  Often will feel hemorrhoids.  Has been told to try fiber.  Has dysphagia, for 6 months.  Solids only.  Has pyrosis, especially at night.     Review of systems: Pertinent positive and negative review of systems were noted in the above HPI section. Complete review of systems was performed and was otherwise normal.   Past Medical History:  Diagnosis Date  . Allergy   . Anxiety   . Depression   . Gallstones    abdominal pain and nausea   . GERD (gastroesophageal reflux disease)    rare occasional.    Past Surgical History:  Procedure Laterality Date  . CHOLECYSTECTOMY N/A 06/04/2013   Procedure: LAPAROSCOPIC CHOLECYSTECTOMY WITH INTRAOPERATIVE CHOLANGIOGRAM;  Surgeon: Velora Hecklerodd M Gerkin, MD;  Location: [facility];  Service: General;  Laterality: N/A;  . vascectomy    . WISDOM TOOTH EXTRACTION      Current Outpatient Prescriptions  Medication Sig Dispense Refill  . buPROPion (WELLBUTRIN XL) 150 MG 24 hr tablet Take 1 tablet (150 mg total) by mouth daily. Can give generic if available. 30 tablet 0  . busPIRone (BUSPAR) 7.5 MG tablet Take 1 tablet (7.5 mg total) by mouth 2 (two) times daily. 60 tablet 0   No current facility-administered medications for this visit.     Allergies as of 05/22/2016 - Review Complete 05/22/2016  Allergen Reaction Noted  . Amoxicillin Rash 05/19/2013  . Penicillins Rash 05/19/2013    Family History   Problem Relation Age of Onset  . Peptic Ulcer Father   . Other Brother     esophageal stricture with dilation  . Crohn's disease Maternal Grandmother     Social History   Social History  . Marital status: Married    Spouse name: N/A  . Number of children: 3  . Years of education: N/A   Occupational History  . clergy    Social History Main Topics  . Smoking status: Never Smoker  . Smokeless tobacco: Never Used  . Alcohol use Yes     Comment: maybe 2 beers a month  . Drug use: No  . Sexual activity: Yes   Other Topics Concern  . Not on file   Social History Narrative  . No narrative on file     Physical Exam: BP 126/80 (BP Location: Left Arm, Patient Position: Sitting, Cuff Size: Normal)   Pulse 76   Ht 5' 10.5" (1.791 m) Comment: height measured without shoes  Wt 189 lb 6 oz (85.9 kg)   BMI 26.79 kg/m  Constitutional: generally well-appearing Psychiatric: alert and oriented x3 Eyes: extraocular movements intact Mouth: oral pharynx moist, no lesions Neck: supple no lymphadenopathy Cardiovascular: heart regular rate and rhythm Lungs: clear to auscultation bilaterally Abdomen: soft, nontender, nondistended, no obvious ascites, no peritoneal signs, normal bowel sounds Extremities: no lower extremity edema bilaterally Skin: no lesions on visible extremities   Assessment and plan: 46 y.o.  male with  Elevated total bilirubin, intermittent rectal bleeding, intermittent dysphagia to solid foods.  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 He is going to get fractionated bilirubin levels today as well as a I will order an abdominal ultrasound to exclude biliary obstruction which I think is very unlikely. He has chronic intermittent solid food dysphagia. No significant chronic pyrosis but only occasionally pyrosis. I recommended upper endoscopy at the same time we'll proceed with colonoscopy for his minor  intermittent rectal bleeding. Last here recommended he try to add fiber to his diet to help with his inconsistent bowel pattern.    Please see the "Patient Instructions" section for addition details about the plan.   Rob Bunting, MD [facility] 05/22/2016, 2:48 PM  Cc: Esperanza Richters, PA-C

## 2016-05-22 NOTE — Patient Instructions (Addendum)
You will have labs checked today in the basement lab.  Please head down after you check out with the front desk  (fractionated bilirubin).  You have been scheduled for an endoscopy and colonoscopy. Please follow the written instructions given to you at your visit today. Please pick up your prep supplies at the pharmacy within the next 1-3 days. If you use inhalers (even only as needed), please bring them with you on the day of your procedure. Your physician has requested that you go to www.startemmi.com and enter the access code given to you at your visit today. This web site gives a general overview about your procedure. However, you should still follow specific instructions given to you by our office regarding your preparation for the procedure.  You have been scheduled [facility] an abdominal [facility] (1st floor of hospital) on 05/30/16 at 9 am . Please arrive 15 minutes prior to your appointment for registration. Make certain not to have anything to eat or drink 12 hours prior to your appointment. Should you need to reschedule your appointment, please contact radiology at [phone]. This test typically takes about 30 minutes to perform.  If you are age 45 or older, your body mass index should be between 23-30. Your Body mass index is 26.79 kg/m. If this is out of the aforementioned range listed, please consider follow up with your Primary Care Provider.  If you are age 45 or younger, your body mass index should be between 19-25. Your Body mass index is 26.79 kg/m. If this is out of the aformentioned range listed, please consider follow up with your Primary Care Provider.

## 2016-05-24 LAB — BILIRUBIN,DIRECT & INDIRECT (FRACTIONATED): Bilirubin, Direct: 0.2 mg/dL (ref <=0.2)

## 2016-05-30 ENCOUNTER — Other Ambulatory Visit: Payer: Self-pay

## 2016-05-30 ENCOUNTER — Ambulatory Visit (HOSPITAL_COMMUNITY)
Admission: RE | Admit: 2016-05-30 | Discharge: 2016-05-30 | Disposition: A | Discharge status: Home / Self Care | Payer: BLUE CROSS/BLUE SHIELD | Source: Ambulatory Visit | Attending: Gastroenterology | Admitting: Gastroenterology

## 2016-05-30 ENCOUNTER — Telehealth: Payer: Self-pay

## 2016-05-30 DIAGNOSIS — R1310 Dysphagia, unspecified: Secondary | ICD-10-CM | POA: Diagnosis not present

## 2016-05-30 DIAGNOSIS — K625 Hemorrhage of anus and rectum: Secondary | ICD-10-CM | POA: Diagnosis not present

## 2016-05-30 DIAGNOSIS — R17 Unspecified jaundice: Secondary | ICD-10-CM | POA: Insufficient documentation

## 2016-05-30 DIAGNOSIS — R7989 Other specified abnormal findings of blood chemistry: Secondary | ICD-10-CM | POA: Diagnosis not present

## 2016-05-30 NOTE — Telephone Encounter (Signed)
-----   Message from Rachael Fee, MD sent at 05/30/2016  8:02 AM EDT -----   Not sure why the indirect bilirubin was "canceled by the ancillary"    Can you check on this and have it redrawn if needed.  Thanks

## 2016-05-30 NOTE — Telephone Encounter (Signed)
Called our lab and spoke to [facility], asked her why this test was cancelled. She called to [facility] lab, Cassie states that an additional lab for a Total bilirubin was also needed and that [facility] did not contact them for this add-on. I asked why our system does not have this included already for the adult, like it does for the neonatal test. She did not have an answer for this.  I called customer service at [facility], they looked back in their documentation and on 3/29 they sent fax to our lab with requested add-on. They did not hear back from our lab and cancelled test on 3/30. I asked about getting a credit to patient's insurance. I was then transferred to client billing. Spoke to [place], explained what had happened and asked about getting a credit. She cannot do this and said I needed to speak to our Sales Rep, Molly Maduro "[employer]. His phone is: [phone]. I called Arlana Pouch and explained to him what the situation is, asked that he get a credit to this patient's insurance. He took information and will take care of this.

## 2016-05-31 ENCOUNTER — Other Ambulatory Visit: Payer: BLUE CROSS/BLUE SHIELD

## 2016-05-31 DIAGNOSIS — R17 Unspecified jaundice: Secondary | ICD-10-CM | POA: Diagnosis not present

## 2016-05-31 LAB — BILIRUBIN, TOTAL: BILIRUBIN TOTAL: 1.9 mg/dL — AB (ref 0.2–1.2)

## 2016-05-31 LAB — BILIRUBIN,DIRECT & INDIRECT (FRACTIONATED)
BILIRUBIN DIRECT: 0.4 mg/dL — AB (ref <=0.2)
BILIRUBIN INDIRECT: 1.5 mg/dL — AB (ref 0.2–1.2)

## 2016-06-18 ENCOUNTER — Other Ambulatory Visit: Payer: Self-pay | Admitting: Medical

## 2016-07-18 ENCOUNTER — Encounter: Payer: Self-pay | Admitting: Gastroenterology

## 2016-07-26 ENCOUNTER — Ambulatory Visit (AMBULATORY_SURGERY_CENTER): Payer: BLUE CROSS/BLUE SHIELD | Admitting: Gastroenterology

## 2016-07-26 ENCOUNTER — Encounter: Payer: Self-pay | Admitting: Gastroenterology

## 2016-07-26 VITALS — BP 118/76 | HR 79 | Temp 98.6°F | Resp 17 | Ht 70.5 in | Wt 189.0 lb

## 2016-07-26 DIAGNOSIS — K209 Esophagitis, unspecified without bleeding: Secondary | ICD-10-CM

## 2016-07-26 DIAGNOSIS — R1310 Dysphagia, unspecified: Secondary | ICD-10-CM

## 2016-07-26 DIAGNOSIS — K228 Other specified diseases of esophagus: Secondary | ICD-10-CM | POA: Diagnosis not present

## 2016-07-26 DIAGNOSIS — K219 Gastro-esophageal reflux disease without esophagitis: Secondary | ICD-10-CM | POA: Diagnosis not present

## 2016-07-26 DIAGNOSIS — K625 Hemorrhage of anus and rectum: Secondary | ICD-10-CM | POA: Diagnosis not present

## 2016-07-26 MED ORDER — SODIUM CHLORIDE 0.9 % IV SOLN: 500.0000 mL | INTRAVENOUS | Status: DC

## 2016-07-26 NOTE — Op Note (Signed)
[facility] Patient Name: Brandon Barnes Procedure Date: 07/26/2016 10:25 AM MRN: [id number] Endoscopist: Rachael Fee , MD Age: 46 Referring MD:  Date of Birth: 09-25-1970 Gender: Male Account #: [id number] Procedure:                Upper GI endoscopy Indications:              Dysphagia Medicines:                Monitored Anesthesia Care Procedure:                Pre-Anesthesia Assessment:                           - Prior to the procedure, a History and Physical                            was performed, and patient medications and                            allergies were reviewed. The patient's tolerance of                            previous anesthesia was also reviewed. The risks                            and benefits of the procedure and the sedation                            options and risks were discussed with the patient.                            All questions were answered, and informed consent                            was obtained. Prior Anticoagulants: The patient has                            taken no previous anticoagulant or antiplatelet                            agents. ASA Grade Assessment: II - A patient with                            mild systemic disease. After reviewing the risks                            and benefits, the patient was deemed in                            satisfactory condition to undergo the procedure.                           After obtaining informed consent, the endoscope was  passed under direct vision. Throughout the                            procedure, the patient's blood pressure, pulse, and                            oxygen saturations were monitored continuously. The                            Endoscope was introduced through the mouth, and                            advanced to the second part of duodenum. The upper                            GI endoscopy was accomplished without difficulty.                             The patient tolerated the procedure well. Scope In: Scope Out: Findings:                 Mucosal changes including slight longitudinal                            furrows were found in the mid, distal esophagus.                            Biopsies were obtained from the proximal and distal                            esophagus with cold forceps for histology of                            suspected eosinophilic esophagitis.                           There was edema, very mild inflammation at the GE                            junction that created a slight stenosis (GERD vs.                            EoE effect).                           A small hiatal hernia was present.                           The exam was otherwise without abnormality. Complications:            No immediate complications. Estimated blood loss:                            None. Estimated Blood Loss:     Estimated blood loss: none. Impression:               -  Small hiatal hernia.                           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 Biopsies taken from                            esophagus and sent to pathology. This may just all                            be GERD, acid related. Recommendation:           - Patient has a contact number available for                            emergencies. The signs and symptoms of potential                            delayed complications were discussed with the                            patient. Return to normal activities tomorrow.                            Written discharge instructions were provided to the                            patient.                           - Resume previous diet.                           - Continue present medications.                           - Await pathology results. Rachael Fee, MD 07/26/2016 10:41:56 AM This report has been signed electronically.

## 2016-07-26 NOTE — Progress Notes (Signed)
Report to PACU, RN, vss, BBS= Clear.  

## 2016-07-26 NOTE — Progress Notes (Signed)
Called to room to assist during endoscopic procedure.  Patient ID and intended procedure confirmed with present staff. Received instructions for my participation in the procedure from the performing physician.  

## 2016-07-26 NOTE — Op Note (Signed)
[facility] Patient Name: Brandon Barnes Procedure Date: 07/26/2016 10:07 AM MRN: [id number] Endoscopist: Rachael Feeaniel P Jacobs , MD Age: [age] Referring MD:  Date of Birth: [birth year] Gender: Male Account #: [id number] Procedure:                Colonoscopy Indications:              Rectal bleeding Medicines:                Monitored Anesthesia Care Procedure:                Pre-Anesthesia Assessment:                           - Prior to the procedure, a History and Physical                            was performed, and patient medications and                            allergies were reviewed. The patient's tolerance of                            previous anesthesia was also reviewed. The risks                            and benefits of the procedure and the sedation                            options and risks were discussed with the patient.                            All questions were answered, and informed consent                            was obtained. Prior Anticoagulants: The patient has                            taken no previous anticoagulant or antiplatelet                            agents. ASA Grade Assessment: II - A patient with                            mild systemic disease. After reviewing the risks                            and benefits, the patient was deemed in                            satisfactory condition to undergo the procedure.                           After obtaining informed consent, the colonoscope  was passed under direct vision. Throughout the                            procedure, the patient's blood pressure, pulse, and                            oxygen saturations were monitored continuously. The                            Colonoscope was introduced through the anus and                            advanced to the the cecum, identified by                            appendiceal orifice and ileocecal valve. The               colonoscopy was performed without difficulty. The                            patient tolerated the procedure well. The quality                            of the bowel preparation was excellent. The                            ileocecal valve, appendiceal orifice, and rectum                            were photographed. Scope In: 10:14:37 AM Scope Out: 10:23:30 AM Scope Withdrawal Time: 0 hours 6 minutes 39 seconds  Total Procedure Duration: 0 hours 8 minutes 53 seconds  Findings:                 The entire examined colon appeared normal on direct                            and retroflexion views. Complications:            No immediate complications. Estimated blood loss:                            None. Estimated Blood Loss:     Estimated blood loss: none. Impression:               - The entire examined colon is normal on direct and                            retroflexion views.                           - No specimens collected. Recommendation:           - Patient has a contact number available for                            emergencies. The signs and symptoms  of potential                            delayed complications were discussed with the                            patient. Return to normal activities tomorrow.                            Written discharge instructions were provided to the                            patient.                           - Resume previous diet.                           - Continue present medications. Try taking the                            fiber supplement daily, this will probably help                            your straining and in turn that will help what I                            believe is bleeding from intermittent hemorrhoids                            (none seen today however).                           - Repeat colonoscopy in 10 years for screening                            purposes. Rachael Fee, MD 07/26/2016 10:33:18  AM This report has been signed electronically.

## 2016-07-26 NOTE — Patient Instructions (Signed)
YOU HAD AN ENDOSCOPIC PROCEDURE TODAY AT THE [facility]:   Refer to the procedure report that was given to you for any specific questions about what was found during the examination.  If the procedure report does not answer your questions, please call your gastroenterologist to clarify.  If you requested that your care partner not be given the details of your procedure findings, then the procedure report has been included in a sealed envelope for you to review at your convenience later.  YOU SHOULD EXPECT: Some feelings of bloating in the abdomen. Passage of more gas than usual.  Walking can help get rid of the air that was put into your GI tract during the procedure and reduce the bloating. If you had a lower endoscopy (such as a colonoscopy or flexible sigmoidoscopy) you may notice spotting of blood in your stool or on the toilet paper. If you underwent a bowel prep for your procedure, you may not have a normal bowel movement for a few days.  Please Note:  You might notice some irritation and congestion in your nose or some drainage.  This is from the oxygen used during your procedure.  There is no need for concern and it should clear up in a day or so.  SYMPTOMS TO REPORT IMMEDIATELY:   Following lower endoscopy (colonoscopy or flexible sigmoidoscopy):  Excessive amounts of blood in the stool  Significant tenderness or worsening of abdominal pains  Swelling of the abdomen that is new, acute  Fever of 100F or higher   Following upper endoscopy (EGD)  Vomiting of blood or coffee ground material  New chest pain or pain under the shoulder blades  Painful or persistently difficult swallowing  New shortness of breath  Fever of 100F or higher  Black, tarry-looking stools  For urgent or emergent issues, a gastroenterologist can be reached at any hour by calling [phone].  May try a fiber supplement (benafiber) daily.   DIET:  We do recommend a small meal at first, but  then you may proceed to your regular diet.  Drink plenty of fluids but you should avoid alcoholic beverages for 24 hours.  ACTIVITY:  You should plan to take it easy for the rest of today and you should NOT DRIVE or use heavy machinery until tomorrow (because of the sedation medicines used during the test).    FOLLOW UP: Our staff will call the number listed on your records the next business day following your procedure to check on you and address any questions or concerns that you may have regarding the information given to you following your procedure. If we do not reach you, we will leave a message.  However, if you are feeling well and you are not experiencing any problems, there is no need to return our call.  We will assume that you have returned to your regular daily activities without incident.  If any biopsies were taken you will be contacted by phone or by letter within the next 1-3 weeks.  Please call us at [phone] if you have not heard about the biopsies in 3 weeks.    SIGNATURES/CONFIDENTIALITY: You and/or your care partner have signed paperwork which will be entered into your electronic medical record.  These signatures attest to the fact that that the information above on your After Visit Summary has been reviewed and is understood.  Full responsibility of the confidentiality of this discharge information lies with you and/or your care-partner.  Thank you for  letting us take care of your healthcare needs today,

## 2016-07-29 ENCOUNTER — Telehealth: Payer: Self-pay | Admitting: *Deleted

## 2016-07-29 NOTE — Telephone Encounter (Signed)
  Follow up Call-  Call back number 07/26/2016 07/26/2016  Post procedure Call Back phone  # [phone] -  Permission to leave phone message Yes No  comments ok for message -  Some recent data might be hidden     Patient questions:  Do you have a fever, pain , or abdominal swelling? No. Pain Score  0 *  Have you tolerated food without any problems? Yes.    Have you been able to return to your normal activities? Yes.    Do you have any questions about your discharge instructions: Diet   No. Medications  No. Follow up visit  No.  Do you have questions or concerns about your Care? No.  Actions: * If pain score is 4 or above: No action needed, pain <4.

## 2016-07-30 ENCOUNTER — Other Ambulatory Visit: Payer: Self-pay | Admitting: Medical

## 2016-08-02 NOTE — Telephone Encounter (Signed)
Tried to reach pt. Left pt a message to call back. Pt due for follow up please call and schedule appointment.

## 2016-08-05 ENCOUNTER — Other Ambulatory Visit: Payer: Self-pay

## 2016-08-05 MED ORDER — OMEPRAZOLE 40 MG PO CPDR: 40.0000 mg | DELAYED_RELEASE_CAPSULE | Freq: Two times a day (BID) | ORAL | 3 refills | Status: DC

## 2016-08-06 NOTE — Telephone Encounter (Signed)
Left message on VM for patient to call office to schedule follow up

## 2016-08-13 ENCOUNTER — Other Ambulatory Visit: Payer: Self-pay

## 2016-08-13 DIAGNOSIS — K219 Gastro-esophageal reflux disease without esophagitis: Secondary | ICD-10-CM | POA: Insufficient documentation

## 2016-08-13 MED ORDER — OMEPRAZOLE 40 MG PO CPDR: 40.0000 mg | DELAYED_RELEASE_CAPSULE | Freq: Every day | ORAL | 3 refills | Status: DC

## 2016-09-01 ENCOUNTER — Other Ambulatory Visit: Payer: Self-pay | Admitting: Medical

## 2016-09-04 NOTE — Telephone Encounter (Signed)
Pharmacy is checking status of refill, please advise. [facility] [address]

## 2016-09-04 NOTE — Telephone Encounter (Signed)
Pt due for follow up please call and schedule appointment.  

## 2016-09-05 ENCOUNTER — Ambulatory Visit (INDEPENDENT_AMBULATORY_CARE_PROVIDER_SITE_OTHER): Payer: BLUE CROSS/BLUE SHIELD | Admitting: Medical

## 2016-09-05 ENCOUNTER — Other Ambulatory Visit: Payer: Self-pay | Admitting: Medical

## 2016-09-05 ENCOUNTER — Encounter: Payer: Self-pay | Admitting: Medical

## 2016-09-05 VITALS — BP 130/80 | HR 81 | Temp 98.0°F | Resp 16 | Ht 70.0 in | Wt 185.6 lb

## 2016-09-05 DIAGNOSIS — F419 Anxiety disorder, unspecified: Secondary | ICD-10-CM | POA: Diagnosis not present

## 2016-09-05 DIAGNOSIS — F32A Depression, unspecified: Secondary | ICD-10-CM

## 2016-09-05 DIAGNOSIS — K209 Esophagitis, unspecified without bleeding: Secondary | ICD-10-CM

## 2016-09-05 DIAGNOSIS — F329 Major depressive disorder, single episode, unspecified: Secondary | ICD-10-CM | POA: Diagnosis not present

## 2016-09-05 MED ORDER — BUPROPION HCL ER (XL) 150 MG PO TB24: 150.0000 mg | ORAL_TABLET | Freq: Every day | ORAL | 11 refills | Status: DC

## 2016-09-05 MED ORDER — BUSPIRONE HCL 7.5 MG PO TABS: 7.5000 mg | ORAL_TABLET | Freq: Two times a day (BID) | ORAL | 5 refills | Status: DC

## 2016-09-05 NOTE — Progress Notes (Signed)
Subjective:    Patient ID: Brandon Barnes, male    DOB: 1970/08/02, 46 y.o.   MRN: [id number]  HPI  Pt in for follow up.  Pt states negative colonoscopy and egd as well recent past.  Pt had egd and thought maybe eosonophilic esophagitis. He feels better with omeprazole. He thinks he won't repeat egd due to cost.(the later state would do but might be a short delay)  Pt states his mood and anxiety has improved with wellbutrin and buspar. But recently two employees quite and now some recent more stress.   Pt has been eating healthy and getting more exercise.     Review of Systems  Constitutional: Negative for chills, fatigue and fever.  Respiratory: Negative for cough, chest tightness, shortness of breath and wheezing.   Cardiovascular: Negative for chest pain and palpitations.  Gastrointestinal: Negative for abdominal pain, blood in stool, diarrhea, nausea and vomiting.  Genitourinary: Negative for dysuria, flank pain, genital sores, hematuria, penile pain, penile swelling, scrotal swelling and urgency.  Musculoskeletal: Negative for back pain and joint swelling.  Skin: Negative for rash.  Neurological: Negative for dizziness, syncope, weakness and headaches.  Hematological: Negative for adenopathy. Does not bruise/bleed easily.  Psychiatric/Behavioral: Negative for behavioral problems, confusion, dysphoric mood and suicidal ideas. The patient is not nervous/anxious.     Past Medical History:  Diagnosis Date  . Allergy   . Anxiety   . Depression   . Gallstones    abdominal pain and nausea   . GERD (gastroesophageal reflux disease)    rare occasional.     Social History   Social History  . Marital status: Married    Spouse name: N/A  . Number of children: 3  . Years of education: N/A   Occupational History  . clergy    Social History Main Topics  . Smoking status: Never Smoker  . Smokeless tobacco: Never Used  . Alcohol use Yes     Comment: maybe 2 beers a month    . Drug use: No  . Sexual activity: Yes   Other Topics Concern  . Not on file   Social History Narrative  . No narrative on file    Past Surgical History:  Procedure Laterality Date  . CHOLECYSTECTOMY N/A 06/04/2013   Procedure: LAPAROSCOPIC CHOLECYSTECTOMY WITH INTRAOPERATIVE CHOLANGIOGRAM;  Surgeon: Velora Heckler, MD;  Location: [facility];  Service: General;  Laterality: N/A;  . vascectomy    . WISDOM TOOTH EXTRACTION      Family History  Problem Relation Age of Onset  . Peptic Ulcer Father   . Other Brother        esophageal stricture with dilation  . Crohn's disease Maternal Grandmother     Allergies  Allergen Reactions  . Amoxicillin Rash  . Penicillins Rash    Current Outpatient Prescriptions on File Prior to Visit  Medication Sig Dispense Refill  . buPROPion (WELLBUTRIN XL) 150 MG 24 hr tablet TAKE 1 TABLET BY MOUTH DAILY 30 tablet 0  . busPIRone (BUSPAR) 7.5 MG tablet TAKE 1 TABLET (7.5 MG TOTAL) BY MOUTH 2 (TWO) TIMES DAILY. 60 tablet 0  . omeprazole (PRILOSEC) 40 MG capsule Take 1 capsule (40 mg total) by mouth daily. 90 capsule 3   Current Facility-Administered Medications on File Prior to Visit  Medication Dose Route Frequency Provider Last Rate Last Dose  . 0.9 %  sodium chloride infusion  500 mL Intravenous Continuous Rachael Fee, MD  BP 138/89   Pulse 81   Temp 98 F (36.7 C) (Oral)   Resp 16   Ht 5\' 10"  (1.778 m)   Wt 185 lb 9.6 oz (84.2 kg)   SpO2 97%   BMI 26.63 kg/m       Objective:   Physical Exam  General Mental Status- Alert. General Appearance- Not in acute distress.   Skin General: Color- Normal Color. Moisture- Normal Moisture.  Neck Carotid Arteries- Normal color. Moisture- Normal Moisture. No carotid bruits. No JVD.  Chest and Lung Exam Auscultation: Breath Sounds:-Normal.  Cardiovascular Auscultation:Rythm- Regular. Murmurs & Other Heart Sounds:Auscultation of the heart reveals- No  Murmurs.  Abdomen Inspection:-Inspeection Normal. Palpation/Percussion:Note:No mass. Palpation and Percussion of the abdomen reveal- Non Tender, Non Distended + BS, no rebound or guarding.   Neurologic Cranial Nerve exam:- CN III-XII intact(No nystagmus), symmetric smile. Strength:- 5/5 equal and symmetric strength both upper and lower extremities.      Assessment & Plan:  You are doing well with mood and anxiety. Will refill your wellbutrin and Buspar today for 6 months. If mood worsens let us know.  For esophagitis continue the omeprazole. And consider strongly getting the EGD repeat.  Recommend get flu vaccine in October.   Then in January recommend getting annual physical  Nirvan Laban, Ramon Dredgedward, [place]PA-C

## 2016-09-05 NOTE — Patient Instructions (Addendum)
You are doing well with mood and anxiety. Will refill your wellbutrin and Buspar today for 6 months. If mood worsens let us know.  For esophagitis continue the omeprazole. And consider strongly getting the EGD repeat.  Recommend get flu vaccine in October.   Then in January recommend getting annual physical

## 2016-09-09 NOTE — Telephone Encounter (Signed)
Patient scheduled for 09/05/16 with PCP

## 2016-09-22 ENCOUNTER — Other Ambulatory Visit: Payer: Self-pay | Admitting: Medical

## 2016-10-07 ENCOUNTER — Telehealth: Payer: Self-pay

## 2016-10-07 NOTE — Telephone Encounter (Signed)
-----   Message from Brandt LoosenPatty L Taylor, RN sent at 08/05/2016 10:47 AM EDT ----- repeat EGD in 2 months

## 2016-10-08 NOTE — Telephone Encounter (Signed)
The pt has been advised it is time to set up EGD, he wants to call and set up after he speaks with his wife.

## 2016-11-27 ENCOUNTER — Other Ambulatory Visit: Payer: Self-pay

## 2016-11-27 MED ORDER — BUPROPION HCL ER (XL) 150 MG PO TB24: 150.0000 mg | ORAL_TABLET | Freq: Every day | ORAL | 0 refills | Status: DC

## 2016-11-27 MED ORDER — BUSPIRONE HCL 7.5 MG PO TABS: 7.5000 mg | ORAL_TABLET | Freq: Two times a day (BID) | ORAL | 0 refills | Status: DC

## 2016-11-27 NOTE — Progress Notes (Signed)
Ins req 90d/faxed/thx dmf

## 2016-11-27 NOTE — Telephone Encounter (Signed)
Ins req 90d of Buspirone/faxed/thx dmf

## 2016-11-29 ENCOUNTER — Other Ambulatory Visit: Payer: Self-pay | Admitting: Gastroenterology

## 2017-02-28 ENCOUNTER — Other Ambulatory Visit: Payer: Self-pay | Admitting: Medical

## 2017-03-01 ENCOUNTER — Other Ambulatory Visit: Payer: Self-pay | Admitting: Gastroenterology

## 2017-03-01 ENCOUNTER — Other Ambulatory Visit: Payer: Self-pay | Admitting: Medical

## 2017-03-04 NOTE — Telephone Encounter (Signed)
Called pt and scheduled a cpe on 03/11/2017

## 2017-03-04 NOTE — Telephone Encounter (Signed)
Pt due for follow up please call and schedule appointment.  

## 2017-03-11 ENCOUNTER — Encounter: Payer: Self-pay | Admitting: Medical

## 2017-03-11 ENCOUNTER — Ambulatory Visit (INDEPENDENT_AMBULATORY_CARE_PROVIDER_SITE_OTHER): Payer: BLUE CROSS/BLUE SHIELD | Admitting: Medical

## 2017-03-11 VITALS — BP 123/79 | HR 64 | Temp 97.7°F | Resp 16 | Ht 72.0 in | Wt 195.8 lb

## 2017-03-11 DIAGNOSIS — Z0000 Encounter for general adult medical examination without abnormal findings: Secondary | ICD-10-CM

## 2017-03-11 NOTE — Patient Instructions (Addendum)
For you wellness exam today I have ordered cbc, cmp, lipid panel, and ua.(future labs in)  Vaccine up to date.  Recommend exercise and healthy diet.  We will let you know lab results as they come in.  Follow up date appointment will be determined after lab review.    Preventive Care 18-39 Years, Male Preventive care refers to lifestyle choices and visits with your health care provider that can promote health and wellness. What does preventive care include?  A yearly physical exam. This is also called an annual well check.  Dental exams once or twice a year.  Routine eye exams. Ask your health care provider how often you should have your eyes checked.  Personal lifestyle choices, including: ? Daily care of your teeth and gums. ? Regular physical activity. ? Eating a healthy diet. ? Avoiding tobacco and drug use. ? Limiting alcohol use. ? Practicing safe sex. What happens during an annual well check? The services and screenings done by your health care provider during your annual well check will depend on your age, overall health, lifestyle risk factors, and family history of disease. Counseling Your health care provider may ask you questions about your:  Alcohol use.  Tobacco use.  Drug use.  Emotional well-being.  Home and relationship well-being.  Sexual activity.  Eating habits.  Work and work Statistician.  Screening You may have the following tests or measurements:  Height, weight, and BMI.  Blood pressure.  Lipid and cholesterol levels. These may be checked every 5 years starting at age 67.  Diabetes screening. This is done by checking your blood sugar (glucose) after you have not eaten for a while (fasting).  Skin check.  Hepatitis C blood test.  Hepatitis B blood test.  Sexually transmitted disease (STD) testing.  Discuss your test results, treatment options, and if necessary, the need for more tests with your health care  provider. Vaccines Your health care provider may recommend certain vaccines, such as:  Influenza vaccine. This is recommended every year.  Tetanus, diphtheria, and acellular pertussis (Tdap, Td) vaccine. You may need a Td booster every 10 years.  Varicella vaccine. You may need this if you have not been vaccinated.  HPV vaccine. If you are 40 or younger, you may need three doses over 6 months.  Measles, mumps, and rubella (MMR) vaccine. You may need at least one dose of MMR.You may also need a second dose.  Pneumococcal 13-valent conjugate (PCV13) vaccine. You may need this if you have certain conditions and have not been vaccinated.  Pneumococcal polysaccharide (PPSV23) vaccine. You may need one or two doses if you smoke cigarettes or if you have certain conditions.  Meningococcal vaccine. One dose is recommended if you are age 39-21 years and a first-year college student living in a residence hall, or if you have one of several medical conditions. You may also need additional booster doses.  Hepatitis A vaccine. You may need this if you have certain conditions or if you travel or work in places where you may be exposed to hepatitis A.  Hepatitis B vaccine. You may need this if you have certain conditions or if you travel or work in places where you may be exposed to hepatitis B.  Haemophilus influenzae type b (Hib) vaccine. You may need this if you have certain risk factors.  Talk to your health care provider about which screenings and vaccines you need and how often you need them. This information is not intended to replace advice  given to you by your health care provider. Make sure you discuss any questions you have with your health care provider. Document Released: 04/09/2001 Document Revised: 11/01/2015 Document Reviewed: 12/13/2014 Elsevier Interactive Patient Education  [employer].

## 2017-03-11 NOTE — Progress Notes (Signed)
Subjective:    Patient ID: Brandon Barnes, male    DOB: [birth year], 47 y.o.   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 Married. 3 children. Non smoker and no alcohol.  Children doing well with swimming.  Pt got flu vaccine in September. Tdap up to date. Hiv screen done.  Pt not fasting. Will get future labs done on Friday am.   Not exercising. Plans to exercise with son in spring.   Review of Systems  Constitutional: Negative for chills, fatigue and fever.  Respiratory: Negative for cough, chest tightness, shortness of breath and wheezing.   Cardiovascular: Negative for chest pain and palpitations.  Gastrointestinal: Negative for abdominal distention, abdominal pain, anal bleeding, blood in stool, constipation and diarrhea.       Pt is on prilosec otc. Symptom improved. Pt does not want second endoscopy due to cost.   Genitourinary: Negative for dysuria, flank pain, frequency, hematuria, penile pain, scrotal swelling, testicular pain and urgency.  Musculoskeletal: Negative for back pain.  Skin: Negative for rash.  Neurological: Negative for dizziness, tremors, syncope, speech difficulty and headaches.  Hematological: Negative for adenopathy. Does not bruise/bleed easily.  Psychiatric/Behavioral: Positive for dysphoric mood. Negative for behavioral problems, confusion and self-injury. The patient is nervous/anxious.        Mood and anxiety controlled with meds.    Past Medical History:  Diagnosis Date  . Allergy   . Anxiety   . Depression   . Gallstones    abdominal pain and nausea   . GERD (gastroesophageal reflux disease)    rare occasional.     Social History   Socioeconomic History  . Marital status: Married    Spouse name: Not on file  . Number of children: 3  . Years of education: Not on file  . Highest education level: Not on file  Social Needs  . Financial resource strain: Not on file   . Food insecurity - worry: Not on file  . Food insecurity - inability: Not on file  . Transportation needs - medical: Not on file  . Transportation needs - non-medical: Not on file  Occupational History  . Occupation: clergy  Tobacco Use  . Smoking status: Never Smoker  . Smokeless tobacco: Never Used  Substance and Sexual Activity  . Alcohol use: Yes    Comment: maybe 2 beers a month  . Drug use: No  . Sexual activity: Yes  Other Topics Concern  . Not on file  Social History Narrative  . Not on file    Past Surgical History:  Procedure Laterality Date  . CHOLECYSTECTOMY N/A 06/04/2013   Procedure: LAPAROSCOPIC CHOLECYSTECTOMY WITH INTRAOPERATIVE CHOLANGIOGRAM;  Surgeon: Velora Hecklerodd M Gerkin, MD;  Location: [facility];  Service: General;  Laterality: N/A;  . vascectomy    . WISDOM TOOTH EXTRACTION      Family History  Problem Relation Age of Onset  . Peptic Ulcer Father   . Other Brother        esophageal stricture with dilation  . Crohn's disease Maternal Grandmother     Allergies  Allergen Reactions  . Amoxicillin Rash  . Penicillins Rash    Current Outpatient Medications on File Prior to Visit  Medication Sig Dispense Refill  . buPROPion (WELLBUTRIN XL) 150 MG 24 hr tablet TAKE 1 TABLET BY MOUTH EVERY DAY 90 tablet 0  . busPIRone (BUSPAR) 7.5 MG tablet TAKE 1 TABLET (7.5  MG TOTAL) BY MOUTH 2 (TWO) TIMES DAILY. 180 tablet 0  . omeprazole (PRILOSEC) 40 MG capsule TAKE 1 CAPSULE BY MOUTH EVERY DAY 90 capsule 0   Current Facility-Administered Medications on File Prior to Visit  Medication Dose Route Frequency Provider Last Rate Last Dose  . 0.9 %  sodium chloride infusion  500 mL Intravenous Continuous Rachael Fee, MD        BP 123/79   Pulse 64   Temp 97.7 F (36.5 C) (Oral)   Resp 16   Ht 6' (1.829 m)   Wt 195 lb 12.8 oz (88.8 kg)   SpO2 99%   BMI 26.56 kg/m       Objective:   Physical Exam  General Mental Status- Alert. General Appearance- Not in  acute distress.   Skin General: Color- Normal Color. Moisture- Normal Moisture. No worrisome skin lesions/moles(pt followed by dermatologist)  Neck Carotid Arteries- Normal color. Moisture- Normal Moisture. No carotid bruits. No JVD.  Chest and Lung Exam Auscultation: Breath Sounds:-Normal.  Cardiovascular Auscultation:Rythm- Regular. Murmurs & Other Heart Sounds:Auscultation of the heart reveals- No Murmurs.  Abdomen Inspection:-Inspeection Normal. Palpation/Percussion:Note:No mass. Palpation and Percussion of the abdomen reveal- Non Tender, Non Distended + BS, no rebound or guarding.   Neurologic Cranial Nerve exam:- CN III-XII intact(No nystagmus), symmetric smile. Strength:- 5/5 equal and symmetric strength both upper and lower extremities.   HEENT Head- Normal. Ear Auditory Canal - Left- Normal. Right - Normal.Tympanic Membrane- Left- Normal. Right- Normal. Eye Sclera/Conjunctiva- Left- Normal. Right- Normal. Nose & Sinuses Nasal Mucosa- Left-  Boggy and Congested. Right-  Boggy and  Congested.Bilateral  no maxillary and no  frontal sinus pressure. Mouth & Throat Lips: Upper Lip- Normal: no dryness, cracking, pallor, cyanosis, or vesicular eruption. Lower Lip-Normal: no dryness, cracking, pallor, cyanosis or vesicular eruption. Buccal Mucosa- Bilateral- No Aphthous ulcers. Oropharynx- No Discharge or Erythema. Tonsils: Characteristics- Bilateral- No Erythema or Congestion. Size/Enlargement- Bilateral- No enlargement. Discharge- bilateral-None.   Genital exam- defered.       Assessment & Plan:  For you wellness exam today I have ordered cbc, cmp, lipid panel, and ua.(future labs in)  Vaccine up to date.  Recommend exercise and healthy diet.  We will let you know lab results as they come in.  Follow up date appointment will be determined after lab review.   Isbella Arline, Ramon Dredge, PA-C

## 2017-03-14 ENCOUNTER — Other Ambulatory Visit: Payer: BLUE CROSS/BLUE SHIELD

## 2017-05-09 ENCOUNTER — Other Ambulatory Visit (INDEPENDENT_AMBULATORY_CARE_PROVIDER_SITE_OTHER): Payer: BLUE CROSS/BLUE SHIELD

## 2017-05-09 DIAGNOSIS — Z0000 Encounter for general adult medical examination without abnormal findings: Secondary | ICD-10-CM

## 2017-05-09 LAB — COMPREHENSIVE METABOLIC PANEL
ALBUMIN: 4.1 g/dL (ref 3.5–5.2)
ALK PHOS: 55 U/L (ref 39–117)
ALT: 34 U/L (ref 0–53)
AST: 20 U/L (ref 0–37)
BILIRUBIN TOTAL: 1.2 mg/dL (ref 0.2–1.2)
BUN: 11 mg/dL (ref 6–23)
CALCIUM: 8.9 mg/dL (ref 8.4–10.5)
CO2: 30 mEq/L (ref 19–32)
Chloride: 106 mEq/L (ref 96–112)
Creatinine, Ser: 1.12 mg/dL (ref 0.40–1.50)
GFR: 75.19 mL/min (ref >60.00)
Glucose, Bld: 99 mg/dL (ref 70–99)
Potassium: 3.8 mEq/L (ref 3.5–5.1)
Sodium: 141 mEq/L (ref 135–145)
TOTAL PROTEIN: 6.6 g/dL (ref 6.0–8.3)

## 2017-05-09 LAB — POC URINALSYSI DIPSTICK (AUTOMATED)
Bilirubin, UA: NEGATIVE
Blood, UA: NEGATIVE
GLUCOSE UA: NEGATIVE
Ketones, UA: NEGATIVE
Leukocytes, UA: NEGATIVE
NITRITE UA: NEGATIVE
PROTEIN UA: NEGATIVE
SPEC GRAV UA: 1.015 (ref 1.010–1.025)
UROBILINOGEN UA: 0.2 U/dL
pH, UA: 6 (ref 5.0–8.0)

## 2017-05-09 LAB — LIPID PANEL
CHOLESTEROL: 133 mg/dL (ref 0–200)
HDL: 39.5 mg/dL (ref >39.00)
LDL Cholesterol: 79 mg/dL (ref 0–99)
NONHDL: 93.02
TRIGLYCERIDES: 71 mg/dL (ref 0.0–149.0)
Total CHOL/HDL Ratio: 3
VLDL: 14.2 mg/dL (ref 0.0–40.0)

## 2017-05-09 LAB — CBC
HCT: 44.7 % (ref 39.0–52.0)
HEMOGLOBIN: 15.7 g/dL (ref 13.0–17.0)
MCHC: 35.1 g/dL (ref 30.0–36.0)
MCV: 87.5 fl (ref 78.0–100.0)
PLATELETS: 216 10*3/uL (ref 150.0–400.0)
RBC: 5.11 Mil/uL (ref 4.22–5.81)
RDW: 12.5 % (ref 11.5–15.5)
WBC: 6.1 10*3/uL (ref 4.0–10.5)

## 2017-05-09 NOTE — Progress Notes (Signed)
la 

## 2017-05-31 ENCOUNTER — Other Ambulatory Visit: Payer: Self-pay | Admitting: Medical

## 2017-06-24 DIAGNOSIS — Z23 Encounter for immunization: Secondary | ICD-10-CM | POA: Diagnosis not present

## 2017-08-05 DIAGNOSIS — Z23 Encounter for immunization: Secondary | ICD-10-CM | POA: Diagnosis not present

## 2017-09-15 ENCOUNTER — Ambulatory Visit: Payer: BLUE CROSS/BLUE SHIELD | Admitting: Medical

## 2017-09-15 ENCOUNTER — Telehealth: Payer: Self-pay

## 2017-09-15 ENCOUNTER — Emergency Department (HOSPITAL_BASED_OUTPATIENT_CLINIC_OR_DEPARTMENT_OTHER)
Admission: EM | Admit: 2017-09-15 | Discharge: 2017-09-15 | Disposition: A | Discharge status: Home / Self Care | Payer: BLUE CROSS/BLUE SHIELD | Attending: Emergency Medicine | Admitting: Emergency Medicine

## 2017-09-15 ENCOUNTER — Ambulatory Visit: Payer: BLUE CROSS/BLUE SHIELD | Admitting: Family Medicine

## 2017-09-15 ENCOUNTER — Other Ambulatory Visit: Payer: Self-pay

## 2017-09-15 ENCOUNTER — Encounter (HOSPITAL_BASED_OUTPATIENT_CLINIC_OR_DEPARTMENT_OTHER): Payer: Self-pay | Admitting: *Deleted

## 2017-09-15 DIAGNOSIS — E860 Dehydration: Secondary | ICD-10-CM | POA: Diagnosis not present

## 2017-09-15 DIAGNOSIS — R197 Diarrhea, unspecified: Secondary | ICD-10-CM | POA: Diagnosis not present

## 2017-09-15 DIAGNOSIS — R112 Nausea with vomiting, unspecified: Secondary | ICD-10-CM

## 2017-09-15 DIAGNOSIS — Z79899 Other long term (current) drug therapy: Secondary | ICD-10-CM | POA: Insufficient documentation

## 2017-09-15 LAB — COMPREHENSIVE METABOLIC PANEL
ALT: 49 U/L — AB (ref 0–44)
AST: 31 U/L (ref 15–41)
Albumin: 4.4 g/dL (ref 3.5–5.0)
Alkaline Phosphatase: 68 U/L (ref 38–126)
Anion gap: 10 (ref 5–15)
BUN: 10 mg/dL (ref 6–20)
CHLORIDE: 104 mmol/L (ref 98–111)
CO2: 25 mmol/L (ref 22–32)
CREATININE: 1.08 mg/dL (ref 0.61–1.24)
Calcium: 9.2 mg/dL (ref 8.9–10.3)
GFR calc Af Amer: >60 mL/min (ref >60)
Glucose, Bld: 109 mg/dL — ABNORMAL HIGH (ref 70–99)
Potassium: 3.8 mmol/L (ref 3.5–5.1)
Sodium: 139 mmol/L (ref 135–145)
Total Bilirubin: 1.8 mg/dL — ABNORMAL HIGH (ref 0.3–1.2)
Total Protein: 8.1 g/dL (ref 6.5–8.1)

## 2017-09-15 LAB — CBC
HCT: 50.6 % (ref 39.0–52.0)
Hemoglobin: 18.2 g/dL — ABNORMAL HIGH (ref 13.0–17.0)
MCH: 30.6 pg (ref 26.0–34.0)
MCHC: 36 g/dL (ref 30.0–36.0)
MCV: 85 fL (ref 78.0–100.0)
PLATELETS: 258 10*3/uL (ref 150–400)
RBC: 5.95 MIL/uL — ABNORMAL HIGH (ref 4.22–5.81)
RDW: 12.1 % (ref 11.5–15.5)
WBC: 12.6 10*3/uL — ABNORMAL HIGH (ref 4.0–10.5)

## 2017-09-15 LAB — URINALYSIS, ROUTINE W REFLEX MICROSCOPIC
GLUCOSE, UA: NEGATIVE mg/dL
HGB URINE DIPSTICK: NEGATIVE
Ketones, ur: >80 mg/dL — AB
Leukocytes, UA: NEGATIVE
Nitrite: NEGATIVE
PROTEIN: NEGATIVE mg/dL
Specific Gravity, Urine: >1.03 — ABNORMAL HIGH (ref 1.005–1.030)
pH: 5.5 (ref 5.0–8.0)

## 2017-09-15 LAB — LIPASE, BLOOD: LIPASE: 31 U/L (ref 11–51)

## 2017-09-15 MED ORDER — SODIUM CHLORIDE 0.9 % IV BOLUS
1000.0000 mL | Freq: Once | INTRAVENOUS | Status: AC
  Administered 2017-09-15: 1000 mL via INTRAVENOUS

## 2017-09-15 MED ORDER — ONDANSETRON 8 MG PO TBDP: 8.0000 mg | ORAL_TABLET | Freq: Three times a day (TID) | ORAL | 0 refills | Status: DC | PRN

## 2017-09-15 MED ORDER — ONDANSETRON HCL 4 MG/2ML IJ SOLN
INTRAMUSCULAR | Status: AC
  Administered 2017-09-15: 4 mg via INTRAVENOUS
  Filled 2017-09-15: qty 2

## 2017-09-15 MED ORDER — ONDANSETRON HCL 4 MG/2ML IJ SOLN
4.0000 mg | Freq: Once | INTRAMUSCULAR | Status: AC | PRN
  Administered 2017-09-15: 4 mg via INTRAVENOUS

## 2017-09-15 NOTE — ED Provider Notes (Signed)
[facility] EMERGENCY DEPARTMENT Provider Note   CSN: [id number] Arrival date & time: 09/15/17  1315     History   Chief Complaint Chief Complaint  Patient presents with  . Emesis    HPI Brandon Barnes is a 47 y.o. male.  HPI Brandon Barnes is a 47 y.o. male presents to emergency department with complaint of nausea, vomiting, diarrhea.  Patient states he traveled to [place] with the air 9 days ago, came back yesterday morning.  He states however about 6 days ago while traveling, he started having abdominal cramping and watery diarrhea.  He describes it as "dark yellow watery with tiny chunks", and states he has to use a bathroom about every 30 to 45 minutes.  States he developed severe abdominal cramping just prior to having to use the bathroom.  Otherwise he has had no pain.  He has had 2 episodes of emesis, 1 of them was today.  He denies any fever or chills.  No urinary symptoms, however he states that he has not urinated in 36 hours.  He states he tried to eat a pretzel this morning which stayed down, and has been sipping on Gatorade.  He states that he did see some bright red blood in his stool but states that is normal for him, states he has hemorrhoids and usually sees some red blood in his stool, and had a work-up including colonoscopy last year which was all normal other than hemorrhoids.  He states he has been using Imodium which has not helped.  He states nothing is making his symptoms better or worse.  He has had cholecystectomy otherwise no other abdominal surgeries.  Past Medical History:  Diagnosis Date  . Allergy   . Anxiety   . Depression   . Gallstones    abdominal pain and nausea   . GERD (gastroesophageal reflux disease)    rare occasional.    Patient Active Problem List   Diagnosis Date Noted  . GERD (gastroesophageal reflux disease) 08/13/2016  . Cholelithiasis with cholecystitis 06/02/2013    Past Surgical History:  Procedure Laterality Date  .  CHOLECYSTECTOMY N/A 06/04/2013   Procedure: LAPAROSCOPIC CHOLECYSTECTOMY WITH INTRAOPERATIVE CHOLANGIOGRAM;  Surgeon: Velora Hecklerodd M Gerkin, MD;  Location: [facility];  Service: General;  Laterality: N/A;  . vascectomy    . WISDOM TOOTH EXTRACTION          Home Medications    Prior to Admission medications   Medication Sig Start Date End Date Taking? Authorizing Provider  buPROPion (WELLBUTRIN XL) 150 MG 24 hr tablet TAKE 1 TABLET BY MOUTH EVERY DAY 06/06/17   Saguier, Ramon DredgeEdward, PA-C  busPIRone (BUSPAR) 7.5 MG tablet TAKE 1 TABLET (7.5 MG TOTAL) BY MOUTH 2 (TWO) TIMES DAILY. 03/07/17   Saguier, Ramon DredgeEdward, PA-C  omeprazole (PRILOSEC) 40 MG capsule TAKE 1 CAPSULE BY MOUTH EVERY DAY 11/29/16   Rachael FeeJacobs, Daniel P, MD    Family History Family History  Problem Relation Age of Onset  . Peptic Ulcer Father   . Other Brother        esophageal stricture with dilation  . Crohn's disease Maternal Grandmother     Social History Social History   Tobacco Use  . Smoking status: Never Smoker  . Smokeless tobacco: Never Used  Substance Use Topics  . Alcohol use: Yes    Comment: maybe 2 beers a month  . Drug use: No     Allergies   Amoxicillin and Penicillins   Review of Systems Review of  Systems  Constitutional: Positive for fatigue. Negative for chills and fever.  Respiratory: Negative for cough, chest tightness and shortness of breath.   Cardiovascular: Negative for chest pain, palpitations and leg swelling.  Gastrointestinal: Positive for abdominal pain, blood in stool, diarrhea, nausea and vomiting. Negative for abdominal distention.  Genitourinary: Negative for dysuria, frequency, hematuria and urgency.  Musculoskeletal: Negative for arthralgias, myalgias, neck pain and neck stiffness.  Skin: Negative for rash.  Allergic/Immunologic: Negative for immunocompromised state.  Neurological: Positive for weakness. Negative for dizziness, light-headedness, numbness and headaches.  All other systems  reviewed and are negative.    Physical Exam Updated Vital Signs BP 133/90 (BP Location: Left Arm)   Pulse 94   Temp 98.3 F (36.8 C) (Oral)   Resp 16   Ht 5\' 11"  (1.803 m)   Wt 88.5 kg (195 lb)   SpO2 99%   BMI 27.20 kg/m   Physical Exam  Constitutional: He appears well-developed and well-nourished. No distress.  HENT:  Head: Normocephalic and atraumatic.  Eyes: Conjunctivae are normal.  Neck: Neck supple.  Cardiovascular: Normal rate, regular rhythm and normal heart sounds.  Pulmonary/Chest: Effort normal. No respiratory distress. He has no wheezes. He has no rales.  Abdominal: Soft. Bowel sounds are normal. He exhibits no distension. There is no tenderness. There is no rebound.  Musculoskeletal: He exhibits no edema.  Neurological: He is alert.  Skin: Skin is warm and dry.  Nursing note and vitals reviewed.    ED Treatments / Results  Labs (all labs ordered are listed, but only abnormal results are displayed) Labs Reviewed  COMPREHENSIVE METABOLIC PANEL - Abnormal; Notable for the following components:      Result Value   Glucose, Bld 109 (*)    ALT 49 (*)    Total Bilirubin 1.8 (*)    All other components within normal limits  CBC - Abnormal; Notable for the following components:   WBC 12.6 (*)    RBC 5.95 (*)    Hemoglobin 18.2 (*)    All other components within normal limits  URINALYSIS, ROUTINE W REFLEX MICROSCOPIC - Abnormal; Notable for the following components:   APPearance CLOUDY (*)    Specific Gravity, Urine >1.030 (*)    Bilirubin Urine SMALL (*)    Ketones, ur >80 (*)    All other components within normal limits  GASTROINTESTINAL PANEL BY PCR, STOOL (REPLACES STOOL CULTURE)  LIPASE, BLOOD    EKG None  Radiology No results found.  Procedures Procedures (including critical care time)  Medications Ordered in ED Medications  sodium chloride 0.9 % bolus 1,000 mL (1,000 mLs Intravenous New Bag/Given 09/15/17 1424)  ondansetron (ZOFRAN)  injection 4 mg (4 mg Intravenous Given 09/15/17 1423)     Initial Impression / Assessment and Plan / ED Course  I have reviewed the triage vital signs and the nursing notes.  Pertinent labs & imaging results that were available during my care of the patient were reviewed by me and considered in my medical decision making (see chart for details).     Patient in emergency department with watery diarrhea, nausea, 2 episodes of emesis, abdominal cramping.  Symptoms for 6 days after traveling to [place].  States symptoms have been pretty persistent and he became nervous because he has not urinated in 36 hours.  His vital signs are normal, no fever.  Abdomen is soft, nontender. Will hydrate, send stool culture, apparently looks like water, already received zofran.    WBC  slightly elevated at 12.5, but also Hgb at 18.2, hemoconcertated? Electrolytes normal other than mild elevation in ALT and bili. Urine analysis shows a gravity greater than 1.03, with greater than 80 ketones.  Patient hydrated with 3 L of IV fluids.  He is not vomiting, tolerating oral fluids.  We did sent gastrointestinal panel.  At this point, his diarrhea is most likely viral.  We discussed Zofran for nausea as well as increasing his oral hydration.  We will have him follow-up with family doctor return if worsening symptoms. Abdomen soft, non tender. Do not think he needs any imaging at this time.   Vitals:   09/15/17 1325 09/15/17 1326 09/15/17 1533  BP: 133/90  (!) 140/92  Pulse: 94  82  Resp: 16  16  Temp: 98.3 F (36.8 C)    TempSrc: Oral    SpO2: 99%  100%  Weight:  88.5 kg (195 lb)   Height:  5\' 11"  (1.803 m)     Final Clinical Impressions(s) / ED Diagnoses   Final diagnoses:  Dehydration  Nausea vomiting and diarrhea    ED Discharge Orders        Ordered    ondansetron (ZOFRAN ODT) 8 MG disintegrating tablet  Every 8 hours PRN,   Status:  Discontinued     09/15/17 1734    ondansetron (ZOFRAN  ODT) 8 MG disintegrating tablet  Every 8 hours PRN,   Status:  Discontinued     09/15/17 1735    ondansetron (ZOFRAN ODT) 8 MG disintegrating tablet  Every 8 hours PRN     09/15/17 1736       Jaynie Crumble, PA-C 09/15/17 1741    Long, Arlyss Repress, MD 09/16/17 [phone]

## 2017-09-15 NOTE — ED Notes (Signed)
ED Provider at bedside. 

## 2017-09-15 NOTE — Discharge Instructions (Signed)
Drink plenty of fluids to keep up with your fluid loss. We will call you if you need further treatment of your diarrhea once cultures are back. Return if worsening symptoms.

## 2017-09-15 NOTE — ED Triage Notes (Signed)
Pt c/o abd pain with n/v/d x 6 days including fever and chills

## 2017-09-15 NOTE — Telephone Encounter (Signed)
Chartered loss adjusterAuthor received call from [facility], who stated that pt. has been [place], and recently passed out at a [employer]. His wife is driving him at this point, and asked if he should be seen in the office or go to ED. Author advised pt. to go to ED so as to be fully evaluated and receive IV fluids for diarhhea/vomitting/syncope. Appointment with Dr. Patsy Lageropland cancelled for today, and [facility] let wife know of recommendation to go to ED. Dr. Patsy Lageropland made aware.

## 2017-09-16 LAB — GASTROINTESTINAL PANEL BY PCR, STOOL (REPLACES STOOL CULTURE)
ASTROVIRUS: NOT DETECTED
Adenovirus F40/41: NOT DETECTED
CYCLOSPORA CAYETANENSIS: NOT DETECTED
Campylobacter species: NOT DETECTED
Cryptosporidium: NOT DETECTED
ENTAMOEBA HISTOLYTICA: NOT DETECTED
ENTEROTOXIGENIC E COLI (ETEC): NOT DETECTED
Enteroaggregative E coli (EAEC): NOT DETECTED
Enteropathogenic E coli (EPEC): NOT DETECTED
Giardia lamblia: NOT DETECTED
Norovirus GI/GII: NOT DETECTED
Plesimonas shigelloides: NOT DETECTED
Rotavirus A: NOT DETECTED
SAPOVIRUS (I, II, IV, AND V): NOT DETECTED
SHIGA LIKE TOXIN PRODUCING E COLI (STEC): NOT DETECTED
Salmonella species: NOT DETECTED
Shigella/Enteroinvasive E coli (EIEC): NOT DETECTED
VIBRIO CHOLERAE: NOT DETECTED
VIBRIO SPECIES: NOT DETECTED
Yersinia enterocolitica: NOT DETECTED

## 2017-09-16 NOTE — Telephone Encounter (Signed)
TCM ED follow up call made to patient. Appointment scheduled for 01/23/18. Advised patient to call office if he needs appointment sooner. Patient agreed,

## 2017-12-01 DIAGNOSIS — L820 Inflamed seborrheic keratosis: Secondary | ICD-10-CM | POA: Diagnosis not present

## 2017-12-01 DIAGNOSIS — L570 Actinic keratosis: Secondary | ICD-10-CM | POA: Diagnosis not present

## 2017-12-01 DIAGNOSIS — X32XXXA Exposure to sunlight, initial encounter: Secondary | ICD-10-CM | POA: Diagnosis not present

## 2017-12-01 DIAGNOSIS — L821 Other seborrheic keratosis: Secondary | ICD-10-CM | POA: Diagnosis not present

## 2017-12-01 DIAGNOSIS — D225 Melanocytic nevi of trunk: Secondary | ICD-10-CM | POA: Diagnosis not present

## 2018-09-08 IMAGING — US US ABDOMEN COMPLETE
1 series · 14 of 25 positions shown · non-contrast
Comparison: CT abdomen pelvis of 05/20/2013

CLINICAL DATA: Elevated bilirubin, prior cholecystectomy, symptoms
of gastroesophageal reflux disease

EXAM:
ABDOMEN ULTRASOUND COMPLETE

[Series 1: us abdomen complete · 0.25mm/px · 14 of 102 slices shown]
[im 1/102]
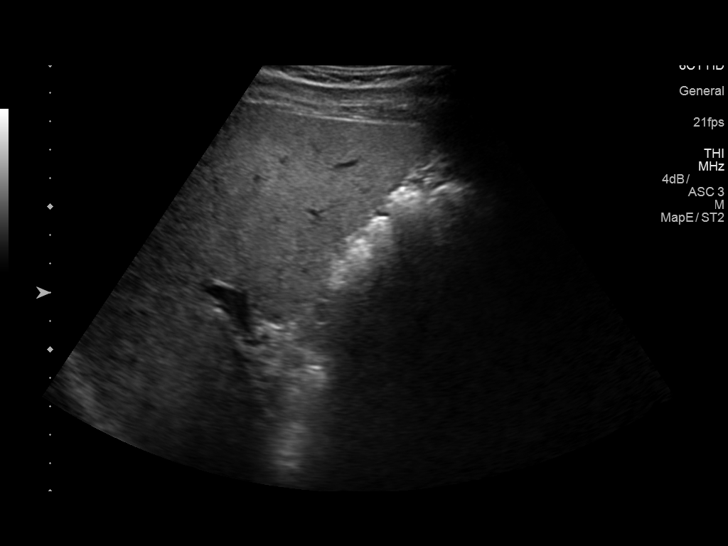
[im 9/102]
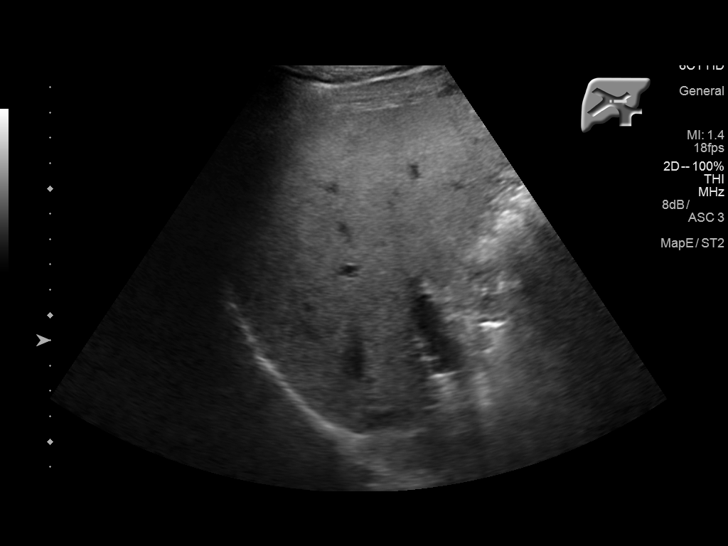
[im 17/102]
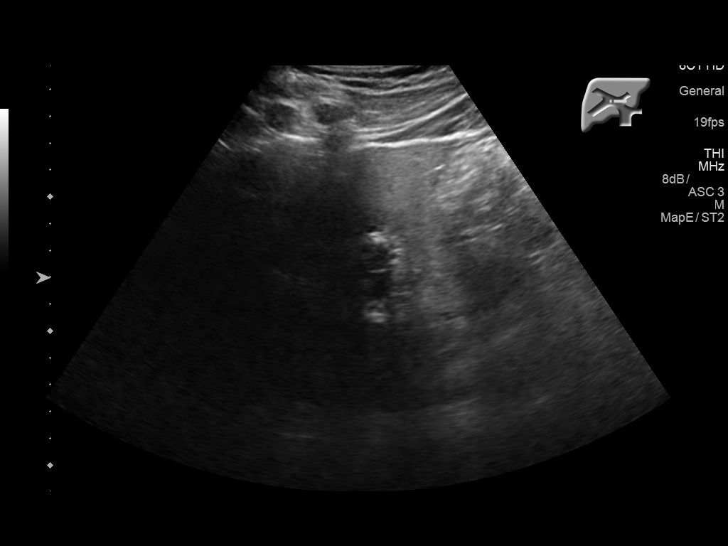
[im 26/102]
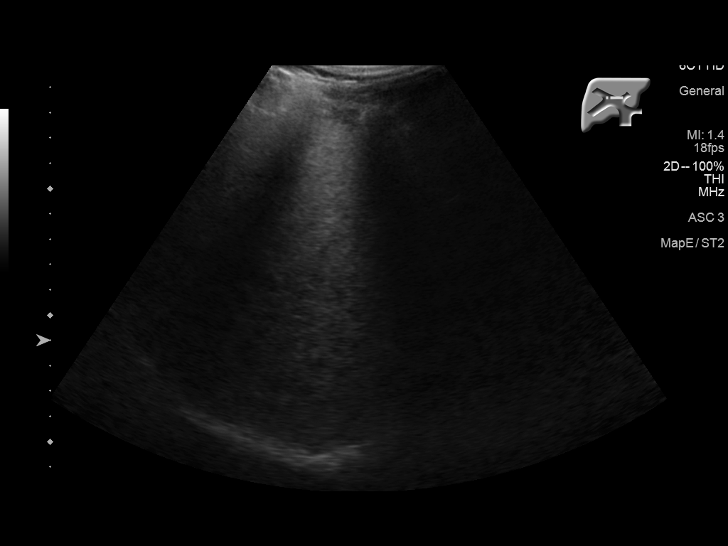
[im 34/102]
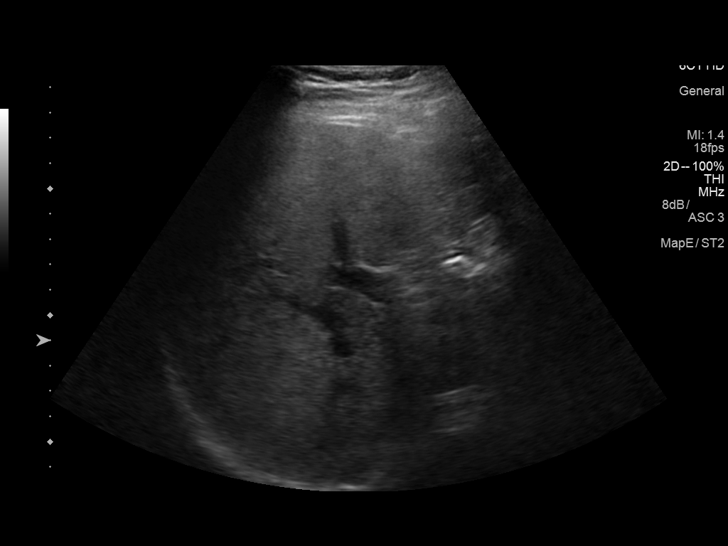
[im 38/102]
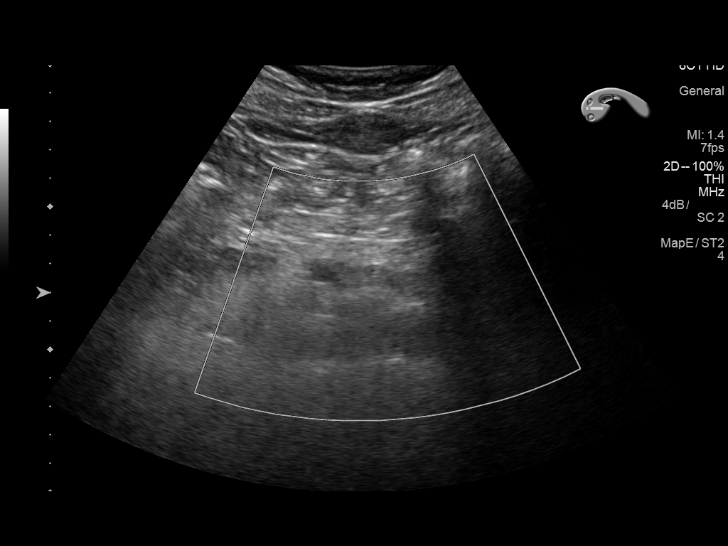
[im 47/102]
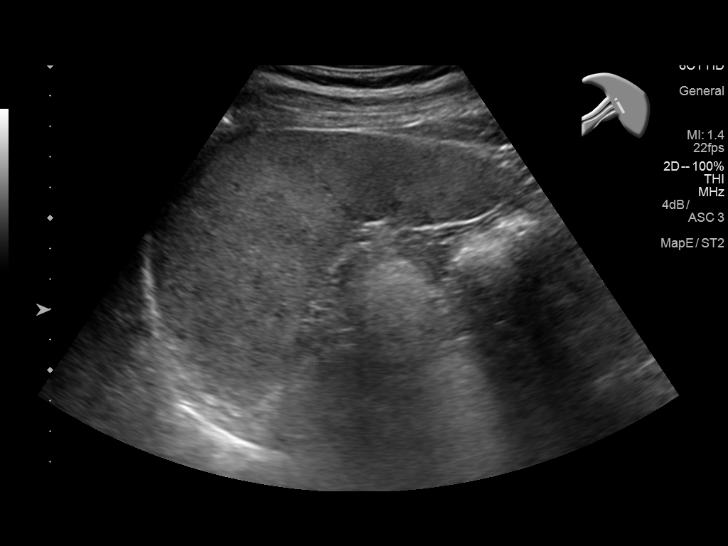
[im 55/102]
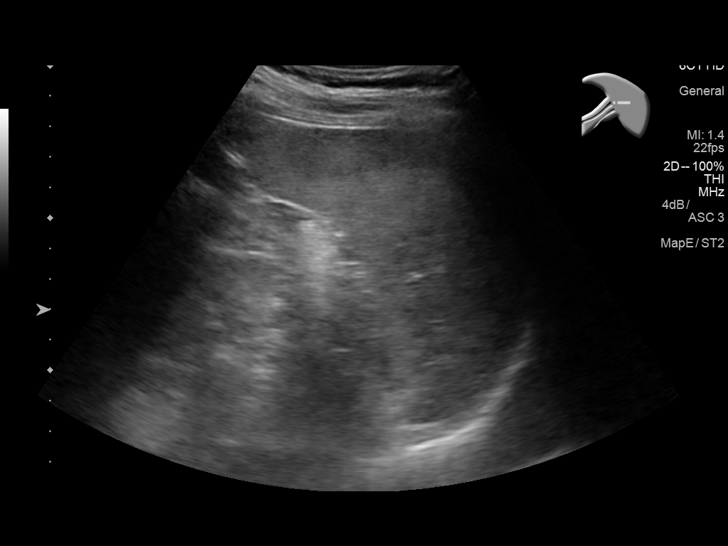
[im 64/102]
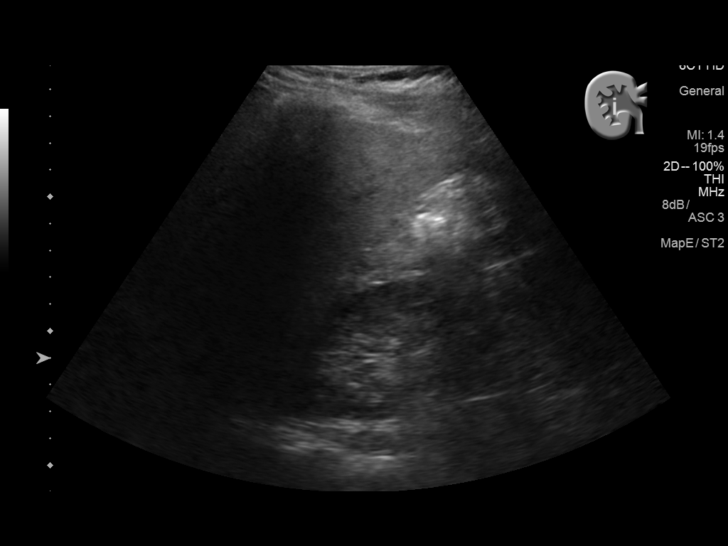
[im 68/102]
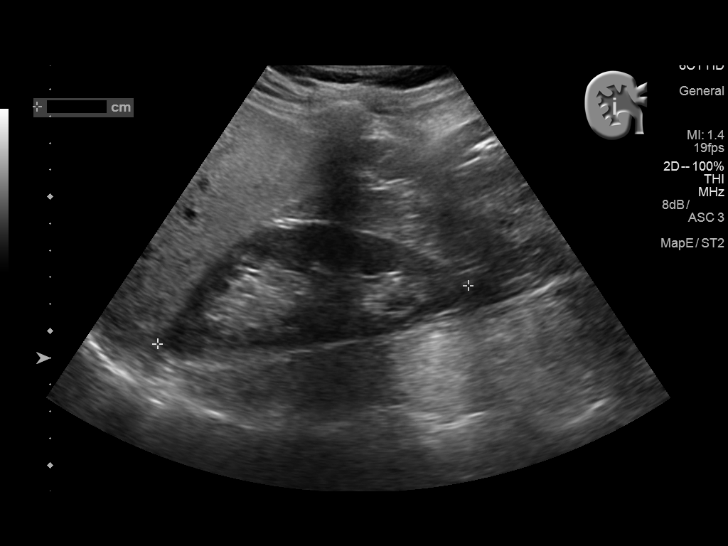
[im 76/102]
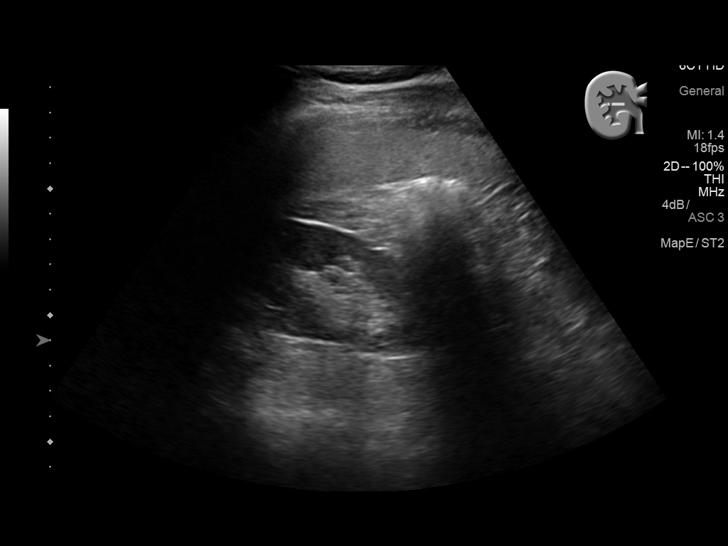
[im 85/102]
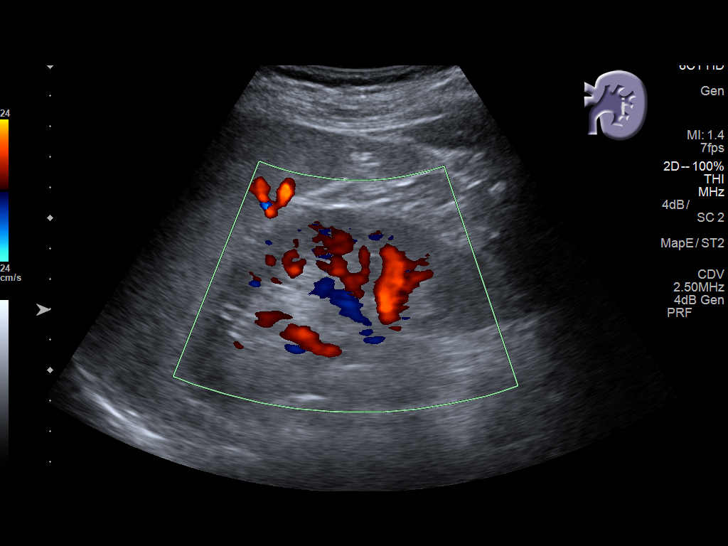
[im 93/102]
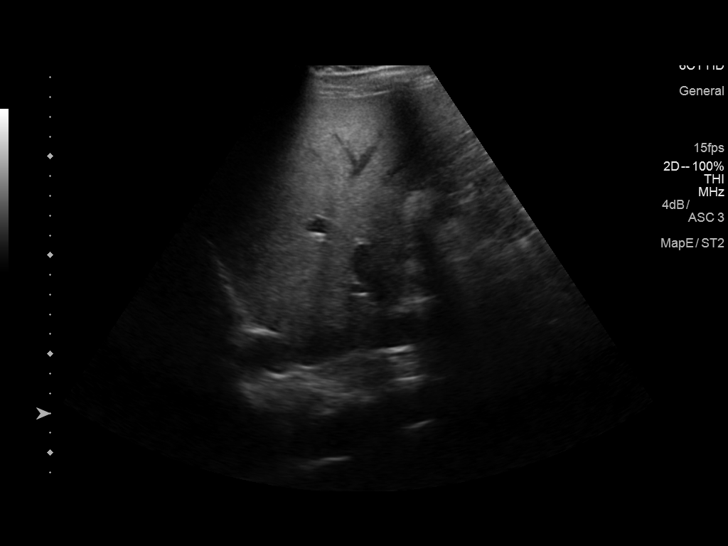
[im 102/102]
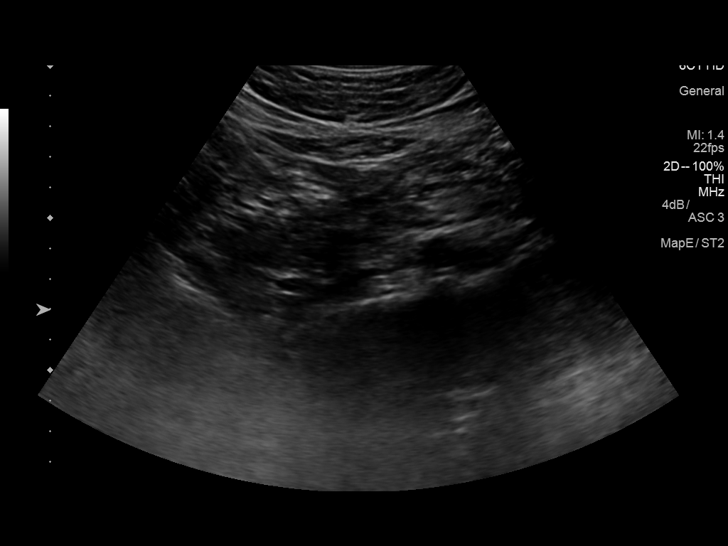

[14 of 25 positions shown; findings below may reference images not displayed]

FINDINGS: Gallbladder: The gallbladder has previously been removed.

Common bile duct: Diameter: The common bile duct is normal measuring
2.4 mm in diameter.

Liver: The liver is somewhat echogenic consistent with fatty
infiltration. No focal hepatic abnormality is seen.

IVC: No abnormality visualized.

Pancreas: The pancreas is not optimally visualized due to overlying
bowel gas.

Spleen: The spleen measures 11.8 cm and is somewhat inhomogeneous.

Right Kidney: Length: 11.8 cm..  No hydronephrosis is seen.

Left Kidney: Length: 11.2 cm..  No hydronephrosis is noted.

Abdominal aorta: The abdominal aorta is normal in caliber.

Other findings: None.
IMPRESSION: 1. Echogenic liver parenchyma consistent with fatty infiltration. No
focal hepatic abnormality.
2. The pancreas is not well seen due to bowel gas.
3. The spleen is somewhat inhomogeneous but no focal abnormality is
seen.

## 2021-08-24 ENCOUNTER — Ambulatory Visit: Payer: BC Managed Care – PPO | Admitting: Medical

## 2021-08-24 VITALS — BP 111/79 | HR 61 | Resp 18 | Ht 71.0 in | Wt 184.0 lb

## 2021-08-24 DIAGNOSIS — K219 Gastro-esophageal reflux disease without esophagitis: Secondary | ICD-10-CM

## 2021-08-24 DIAGNOSIS — Z125 Encounter for screening for malignant neoplasm of prostate: Secondary | ICD-10-CM

## 2021-08-24 DIAGNOSIS — Z23 Encounter for immunization: Secondary | ICD-10-CM | POA: Diagnosis not present

## 2021-08-24 DIAGNOSIS — D649 Anemia, unspecified: Secondary | ICD-10-CM | POA: Diagnosis not present

## 2021-08-24 DIAGNOSIS — Z0000 Encounter for general adult medical examination without abnormal findings: Secondary | ICD-10-CM

## 2021-08-24 LAB — LIPID PANEL
Cholesterol: 159 mg/dL (ref 0–200)
HDL: 49.3 mg/dL (ref >39.00)
LDL Cholesterol: 91 mg/dL (ref 0–99)
NonHDL: 109.98
Total CHOL/HDL Ratio: 3
Triglycerides: 93 mg/dL (ref 0.0–149.0)
VLDL: 18.6 mg/dL (ref 0.0–40.0)

## 2021-08-24 LAB — CBC WITH DIFFERENTIAL/PLATELET
Basophils Absolute: 0.1 10*3/uL (ref 0.0–0.1)
Basophils Relative: 1 % (ref 0.0–3.0)
Eosinophils Absolute: 0.5 10*3/uL (ref 0.0–0.7)
Eosinophils Relative: 7.3 % — ABNORMAL HIGH (ref 0.0–5.0)
HCT: 36.6 % — ABNORMAL LOW (ref 39.0–52.0)
Hemoglobin: 12 g/dL — ABNORMAL LOW (ref 13.0–17.0)
Lymphocytes Relative: 30.1 % (ref 12.0–46.0)
Lymphs Abs: 1.9 10*3/uL (ref 0.7–4.0)
MCHC: 32.9 g/dL (ref 30.0–36.0)
MCV: 79.1 fl (ref 78.0–100.0)
Monocytes Absolute: 0.5 10*3/uL (ref 0.1–1.0)
Monocytes Relative: 8 % (ref 3.0–12.0)
Neutro Abs: 3.5 10*3/uL (ref 1.4–7.7)
Neutrophils Relative %: 53.6 % (ref 43.0–77.0)
Platelets: 299 10*3/uL (ref 150.0–400.0)
RBC: 4.63 Mil/uL (ref 4.22–5.81)
RDW: 14.7 % (ref 11.5–15.5)
WBC: 6.5 10*3/uL (ref 4.0–10.5)

## 2021-08-24 LAB — COMPREHENSIVE METABOLIC PANEL
ALT: 16 U/L (ref 0–53)
AST: 19 U/L (ref 0–37)
Albumin: 4.5 g/dL (ref 3.5–5.2)
Alkaline Phosphatase: 55 U/L (ref 39–117)
BUN: 18 mg/dL (ref 6–23)
CO2: 29 mEq/L (ref 19–32)
Calcium: 9.8 mg/dL (ref 8.4–10.5)
Chloride: 105 mEq/L (ref 96–112)
Creatinine, Ser: 1.09 mg/dL (ref 0.40–1.50)
GFR: 79.53 mL/min (ref >60.00)
Glucose, Bld: 83 mg/dL (ref 70–99)
Potassium: 4.5 mEq/L (ref 3.5–5.1)
Sodium: 139 mEq/L (ref 135–145)
Total Bilirubin: 1 mg/dL (ref 0.2–1.2)
Total Protein: 7.2 g/dL (ref 6.0–8.3)

## 2021-08-24 LAB — PSA: PSA: 0.44 ng/mL (ref 0.10–4.00)

## 2021-08-24 NOTE — Addendum Note (Signed)
Addended by: Gwenevere Abbot on: 08/24/2021 05:26 PM   Modules accepted: Orders

## 2021-08-24 NOTE — Progress Notes (Signed)
Subjective:    Patient ID: Brandon Barnes, male    DOB: Jul 07, 1970, 51 y.o.   MRN: [id number]  HPI  Here to re-establish care. Not seen since 03-11-2017.  No recent wellness exam. Decided will go ahead and do today.   Up to date on colonoscopy.   Vaccines up to date.  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  Pt eating healthier recently but litt more. Married. 3 children. Non smoker and no alcohol.  Genella Rife- was on omeprazole 40 mg daily. Still controlled.  Former depression and anxiety resolved. Better with diet and exercise.  Spring allergies controlled with claritin.    Review of Systems  Constitutional:  Negative for chills, fatigue and fever.  HENT:  Negative for dental problem.   Respiratory:  Negative for cough, shortness of breath and wheezing.   Cardiovascular:  Negative for chest pain and palpitations.  Gastrointestinal:  Negative for abdominal pain, blood in stool, diarrhea and nausea.  Genitourinary:  Negative for dysuria, flank pain and frequency.  Musculoskeletal:  Negative for back pain, myalgias and neck pain.  Skin:  Negative for rash.    Past Medical History:  Diagnosis Date   Allergy    Anxiety    Depression    Gallstones    abdominal pain and nausea    GERD (gastroesophageal reflux disease)    rare occasional.     Social History   Socioeconomic History   Marital status: Married    Spouse name: Not on file   Number of children: 3   Years of education: Not on file   Highest education level: Not on file  Occupational History   Occupation: clergy  Tobacco Use   Smoking status: Never   Smokeless tobacco: Never  Vaping Use   Vaping Use: Never used  Substance and Sexual Activity   Alcohol use: Yes    Comment: maybe 2 beers a month   Drug use: No   Sexual activity: Yes  Other Topics Concern   Not on file  Social History Narrative   Not on file   Social Determinants of Health   Financial Resource Strain: Not  on file  Food Insecurity: Not on file  Transportation Needs: Not on file  Physical Activity: Not on file  Stress: Not on file  Social Connections: Not on file  Intimate Partner Violence: Not on file    Past Surgical History:  Procedure Laterality Date   CHOLECYSTECTOMY N/A 06/04/2013   Procedure: LAPAROSCOPIC CHOLECYSTECTOMY WITH INTRAOPERATIVE CHOLANGIOGRAM;  Surgeon: Velora Heckler, MD;  Location: [facility];  Service: General;  Laterality: N/A;   vascectomy     WISDOM TOOTH EXTRACTION      Family History  Problem Relation Age of Onset   Peptic Ulcer Father    Other Brother        esophageal stricture with dilation   Crohn's disease Maternal Grandmother     Allergies  Allergen Reactions   Amoxicillin Rash   Penicillins Rash    Current Outpatient Medications on File Prior to Visit  Medication Sig Dispense Refill   omeprazole (PRILOSEC) 40 MG capsule TAKE 1 CAPSULE BY MOUTH EVERY DAY 90 capsule 0   Current Facility-Administered Medications on File Prior to Visit  Medication Dose Route Frequency Provider Last Rate Last Admin   0.9 %  sodium chloride infusion  500 mL Intravenous Continuous Rachael Fee, MD        BP 111/79  Pulse 61   Resp 18   Ht 5\' 11"  (1.803 m)   Wt 184 lb (83.5 kg)   SpO2 97%   BMI 25.66 kg/m        Objective:   Physical Exam  General Mental Status- Alert. General Appearance- Not in acute distress.   Skin General: Color- Normal Color. Moisture- Normal Moisture.  Neck Carotid Arteries- Normal color. Moisture- Normal Moisture. No carotid bruits. No JVD.  Chest and Lung Exam Auscultation: Breath Sounds:-Normal.  Cardiovascular Auscultation:Rythm- Regular. Murmurs & Other Heart Sounds:Auscultation of the heart reveals- No Murmurs.  Abdomen Inspection:-Inspeection Normal. Palpation/Percussion:Note:No mass. Palpation and Percussion of the abdomen reveal- Non Tender, Non Distended + BS, no rebound or  guarding.   Neurologic Cranial Nerve exam:- CN III-XII intact(No nystagmus), symmetric smile. Strength:- 5/5 equal and symmetric strength both upper and lower extremities.      Assessment & Plan:   Patient Instructions  For you wellness exam today I have ordered cbc, cmp, psa and lipid panel.   Vaccines tdap today.  Recommend exercise and healthy diet.  We will let you know lab results as they come in.  Follow up date appointment will be determined after lab review.    - controlled with omeprazole.          Genella Rife, PA-C

## 2021-08-24 NOTE — Patient Instructions (Addendum)
For you wellness exam today I have ordered cbc, cmp, psa and lipid panel.   Vaccines tdap today.  Recommend exercise and healthy diet.  We will let you know lab results as they come in.  Follow up date appointment will be determined after lab review.    Genella Rife- controlled with omeprazole.  Preventive Care 22-50 Years Old, Male Preventive care refers to lifestyle choices and visits with your health care provider that can promote health and wellness. Preventive care visits are also called wellness exams. What can I expect for my preventive care visit? Counseling During your preventive care visit, your health care provider may ask about your: Medical history, including: Past medical problems. Family medical history. Current health, including: Emotional well-being. Home life and relationship well-being. Sexual activity. Lifestyle, including: Alcohol, nicotine or tobacco, and drug use. Access to firearms. Diet, exercise, and sleep habits. Safety issues such as seatbelt and bike helmet use. Sunscreen use. Work and work Astronomer. Physical exam Your health care provider will check your: Height and weight. These may be used to calculate your BMI (body mass index). BMI is a measurement that tells if you are at a healthy weight. Waist circumference. This measures the distance around your waistline. This measurement also tells if you are at a healthy weight and may help predict your risk of certain diseases, such as type 2 diabetes and high blood pressure. Heart rate and blood pressure. Body temperature. Skin for abnormal spots. What immunizations do I need?  Vaccines are usually given at various ages, according to a schedule. Your health care provider will recommend vaccines for you based on your age, medical history, and lifestyle or other factors, such as travel or where you work. What tests do I need? Screening Your health care provider may recommend screening tests for certain  conditions. This may include: Lipid and cholesterol levels. Diabetes screening. This is done by checking your blood sugar (glucose) after you have not eaten for a while (fasting). Hepatitis B test. Hepatitis C test. HIV (human immunodeficiency virus) test. STI (sexually transmitted infection) testing, if you are at risk. Lung cancer screening. Prostate cancer screening. Colorectal cancer screening. Talk with your health care provider about your test results, treatment options, and if necessary, the need for more tests. Follow these instructions at home: Eating and drinking  Eat a diet that includes fresh fruits and vegetables, whole grains, lean protein, and low-fat dairy products. Take vitamin and mineral supplements as recommended by your health care provider. Do not drink alcohol if your health care provider tells you not to drink. If you drink alcohol: Limit how much you have to 0-2 drinks a day. Know how much alcohol is in your drink. In the [place] one drink equals one 12 oz bottle of beer (355 mL), one 5 oz glass of wine (148 mL), or one 1 oz glass of hard liquor (44 mL). Lifestyle Brush your teeth every morning and night with fluoride toothpaste. Floss one time each day. Exercise for at least 30 minutes 5 or more days each week. Do not use any products that contain nicotine or tobacco. These products include cigarettes, chewing tobacco, and vaping devices, such as e-cigarettes. If you need help quitting, ask your health care provider. Do not use drugs. If you are sexually active, practice safe sex. Use a condom or other form of protection to prevent STIs. Take aspirin only as told by your health care provider. Make sure that you understand how much to take and what form  to take. Work with your health care provider to find out whether it is safe and beneficial for you to take aspirin daily. Find healthy ways to manage stress, such as: Meditation, yoga, or listening to  music. Journaling. Talking to a trusted person. Spending time with friends and family. Minimize exposure to UV radiation to reduce your risk of skin cancer. Safety Always wear your seat belt while driving or riding in a vehicle. Do not drive: If you have been drinking alcohol. Do not ride with someone who has been drinking. When you are tired or distracted. While texting. If you have been using any mind-altering substances or drugs. Wear a helmet and other protective equipment during sports activities. If you have firearms in your house, make sure you follow all gun safety procedures. What's next? Go to your health care provider once a year for an annual wellness visit. Ask your health care provider how often you should have your eyes and teeth checked. Stay up to date on all vaccines. This information is not intended to replace advice given to you by your health care provider. Make sure you discuss any questions you have with your health care provider. Document Revised: 08/09/2020 Document Reviewed: 08/09/2020 Elsevier Patient Education  2023 [employer].

## 2021-10-16 ENCOUNTER — Encounter: Payer: Self-pay | Admitting: Medical

## 2021-10-19 ENCOUNTER — Other Ambulatory Visit: Payer: Self-pay

## 2021-10-19 ENCOUNTER — Other Ambulatory Visit (INDEPENDENT_AMBULATORY_CARE_PROVIDER_SITE_OTHER): Payer: BC Managed Care – PPO

## 2021-10-19 DIAGNOSIS — D649 Anemia, unspecified: Secondary | ICD-10-CM | POA: Diagnosis not present

## 2021-10-19 LAB — CBC WITH DIFFERENTIAL/PLATELET
Basophils Absolute: 0.1 10*3/uL (ref 0.0–0.1)
Basophils Relative: 1.1 % (ref 0.0–3.0)
Eosinophils Absolute: 0.3 10*3/uL (ref 0.0–0.7)
Eosinophils Relative: 5.1 % — ABNORMAL HIGH (ref 0.0–5.0)
HCT: 39.4 % (ref 39.0–52.0)
Hemoglobin: 12.9 g/dL — ABNORMAL LOW (ref 13.0–17.0)
Lymphocytes Relative: 35.3 % (ref 12.0–46.0)
Lymphs Abs: 2.3 10*3/uL (ref 0.7–4.0)
MCHC: 32.7 g/dL (ref 30.0–36.0)
MCV: 78.1 fl (ref 78.0–100.0)
Monocytes Absolute: 0.4 10*3/uL (ref 0.1–1.0)
Monocytes Relative: 6 % (ref 3.0–12.0)
Neutro Abs: 3.4 10*3/uL (ref 1.4–7.7)
Neutrophils Relative %: 52.5 % (ref 43.0–77.0)
Platelets: 257 10*3/uL (ref 150.0–400.0)
RBC: 5.04 Mil/uL (ref 4.22–5.81)
RDW: 15.9 % — ABNORMAL HIGH (ref 11.5–15.5)
WBC: 6.4 10*3/uL (ref 4.0–10.5)

## 2021-10-19 LAB — IRON: Iron: 24 ug/dL — ABNORMAL LOW (ref 42–165)

## 2021-10-19 NOTE — Progress Notes (Signed)
cbc

## 2021-10-19 NOTE — Addendum Note (Signed)
Addended by: Rosita Kea on: 10/19/2021 10:53 AM   Modules accepted: Orders

## 2021-10-20 MED ORDER — IRON (FERROUS SULFATE) 325 (65 FE) MG PO TABS: 325.0000 mg | ORAL_TABLET | Freq: Every day | ORAL | 1 refills | Status: DC

## 2021-10-20 NOTE — Addendum Note (Signed)
Addended by: Gwenevere Abbot on: 10/20/2021 09:52 AM   Modules accepted: Orders

## 2021-10-22 ENCOUNTER — Encounter: Payer: Self-pay | Admitting: Medical

## 2021-10-23 ENCOUNTER — Other Ambulatory Visit (INDEPENDENT_AMBULATORY_CARE_PROVIDER_SITE_OTHER): Payer: BC Managed Care – PPO

## 2021-10-23 DIAGNOSIS — D649 Anemia, unspecified: Secondary | ICD-10-CM | POA: Diagnosis not present

## 2021-10-26 LAB — FECAL OCCULT BLOOD, IMMUNOCHEMICAL: Fecal Occult Bld: NEGATIVE

## 2022-01-30 ENCOUNTER — Encounter: Payer: Self-pay | Admitting: Medical

## 2022-01-30 ENCOUNTER — Encounter: Payer: Self-pay | Admitting: Gastroenterology

## 2022-01-30 ENCOUNTER — Ambulatory Visit: Payer: BC Managed Care – PPO | Admitting: Medical

## 2022-01-30 VITALS — BP 119/72 | HR 66 | Temp 97.7°F | Resp 18 | Ht 71.0 in | Wt 198.0 lb

## 2022-01-30 DIAGNOSIS — K219 Gastro-esophageal reflux disease without esophagitis: Secondary | ICD-10-CM | POA: Diagnosis not present

## 2022-01-30 MED ORDER — OMEPRAZOLE 40 MG PO CPDR: DELAYED_RELEASE_CAPSULE | ORAL | 3 refills | Status: DC

## 2022-01-30 NOTE — Progress Notes (Signed)
Subjective:    Patient ID: Brandon Barnes, male    DOB: 1971/01/06, 51 y.o.   MRN: [id number]  HPI  Pt in for evaluation.   Pt states he feels like food is getting stuck just above his stomach. Pt feel like getting a lot of reflux/acid. He states will have symptoms no matter what he eats. This is happening daily basis. Very uncomfortable. He states he is eating less due to symptom or very small amounts. Will occur with various foods/all type.   Small hiatal hernia. - Edema at GE junction and suggestion of Eosinophlic Esophagitis. Biopsies taken from esophagus and sent to pathology. This may just all be GERD, acid related. Impression: - Patient has a contact number available for emergencies. The signs and symptoms of potential delayed complications were discussed with the patient. Return to normal activities tomorrow. Written discharge instructions were provided to the patient. - Resume previous diet. - Continue present medications. - Await pathology results.   "The biopsies from his esophagus show high number of eosinophils. This may be EoE but still could be acid related and so he needs prescription for omeprazole 40mg  po bid, disp 60 with 3 refills. He needs to start that now and then needs repeat EGD in 2 months for repeat biopsies. If eosinophil count still very high despite twice daily PPI until then, it is diagnostic of EoE.  Thanks "     Review of Systems  Constitutional:  Negative for chills, fatigue and fever.  Respiratory:  Negative for cough, chest tightness, shortness of breath and wheezing.   Cardiovascular:  Negative for chest pain and palpitations.  Gastrointestinal:  Positive for abdominal pain. Negative for abdominal distention, constipation, diarrhea and nausea.  Musculoskeletal:  Negative for back pain, joint swelling, myalgias and neck pain.  Neurological:  Negative for dizziness, speech difficulty, numbness and headaches.  Hematological:  Negative for adenopathy.  Does not bruise/bleed easily.  Psychiatric/Behavioral:  Negative for behavioral problems and decreased concentration. The patient is not nervous/anxious and is not hyperactive.     Past Medical History:  Diagnosis Date   Allergy    Anxiety    Depression    Gallstones    abdominal pain and nausea    GERD (gastroesophageal reflux disease)    rare occasional.     Social History   Socioeconomic History   Marital status: Married    Spouse name: Not on file   Number of children: 3   Years of education: Not on file   Highest education level: Not on file  Occupational History   Occupation: clergy  Tobacco Use   Smoking status: Never   Smokeless tobacco: Never  Vaping Use   Vaping Use: Never used  Substance and Sexual Activity   Alcohol use: Yes    Comment: maybe 2 beers a month   Drug use: No   Sexual activity: Yes  Other Topics Concern   Not on file  Social History Narrative   Not on file   Social Determinants of Health   Financial Resource Strain: Not on file  Food Insecurity: Not on file  Transportation Needs: Not on file  Physical Activity: Not on file  Stress: Not on file  Social Connections: Not on file  Intimate Partner Violence: Not on file    Past Surgical History:  Procedure Laterality Date   CHOLECYSTECTOMY N/A 06/04/2013   Procedure: LAPAROSCOPIC CHOLECYSTECTOMY WITH INTRAOPERATIVE CHOLANGIOGRAM;  Surgeon: 08/04/2013, MD;  Location: [facility];  Service: General;  Laterality: N/A;   vascectomy     WISDOM TOOTH EXTRACTION      Family History  Problem Relation Age of Onset   Peptic Ulcer Father    Other Brother        esophageal stricture with dilation   Crohn's disease Maternal Grandmother     Allergies  Allergen Reactions   Amoxicillin Rash   Penicillins Rash    Current Outpatient Medications on File Prior to Visit  Medication Sig Dispense Refill   Iron, Ferrous Sulfate, 325 (65 Fe) MG TABS Take 325 mg by mouth daily. 30 tablet 1    omeprazole (PRILOSEC) 40 MG capsule TAKE 1 CAPSULE BY MOUTH EVERY DAY 90 capsule 0   Current Facility-Administered Medications on File Prior to Visit  Medication Dose Route Frequency Provider Last Rate Last Admin   0.9 %  sodium chloride infusion  500 mL Intravenous Continuous Milus Banister, MD        BP 119/72 (BP Location: Right Arm, Patient Position: Sitting, Cuff Size: Large)   Pulse 66   Temp 97.7 F (36.5 C) (Oral)   Resp 18   Ht 5\' 11"  (1.803 m)   Wt 198 lb (89.8 kg)   SpO2 97%   BMI 27.62 kg/m        Objective:   Physical Exam  General Mental Status- Alert. General Appearance- Not in acute distress.   . Chest and Lung Exam Auscultation: Breath Sounds:-Normal.  Cardiovascular Auscultation:Rythm- Regular. Murmurs & Other Heart Sounds:Auscultation of the heart reveals- No Murmurs.  Abdomen Inspection:-Inspeection Normal. Palpation/Percussion:Note:No mass. Palpation and Percussion of the abdomen reveal- Non Tender, Non Distended + BS, no rebound or guarding.  Neurologic Cranial Nerve exam:- CN III-XII intact(No nystagmus), symmetric smile. Strength:- 5/5 equal and symmetric strength both upper and lower extremities.       Assessment & Plan:   Patient Instructions  Gastroesophageal reflux disease, unspecified whether esophagitis present GERD like symptoms recently severe. Reported difficulty swallowing. 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 Restart omeprazole 40 mg bid. Call gi office and try to get quick appointment.  Let me know when you appointment is.  - Ambulatory referral to Gastroenterology    Follow up 2-3 weeks with me or sooner if needed.     Mackie Pai, PA-C

## 2022-01-30 NOTE — Patient Instructions (Addendum)
Gastroesophageal reflux disease, unspecified whether esophagitis present GERD like symptoms recently severe. Reported difficulty swallowing. 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 Restart omeprazole 40 mg bid. Call gi office and try to get quick appointment.  Let me know when you appointment is.  - Ambulatory referral to Gastroenterology    Follow up 2-3 weeks with me or sooner if needed.

## 2022-03-27 ENCOUNTER — Ambulatory Visit: Payer: BC Managed Care – PPO | Admitting: Physician Assistant

## 2022-03-27 ENCOUNTER — Ambulatory Visit: Payer: BC Managed Care – PPO | Admitting: Gastroenterology

## 2022-03-27 ENCOUNTER — Encounter: Payer: Self-pay | Admitting: Physician Assistant

## 2022-03-27 VITALS — BP 128/86 | HR 65 | Ht 71.0 in | Wt 202.0 lb

## 2022-03-27 DIAGNOSIS — R12 Heartburn: Secondary | ICD-10-CM

## 2022-03-27 DIAGNOSIS — K219 Gastro-esophageal reflux disease without esophagitis: Secondary | ICD-10-CM

## 2022-03-27 DIAGNOSIS — R1310 Dysphagia, unspecified: Secondary | ICD-10-CM | POA: Diagnosis not present

## 2022-03-27 MED ORDER — OMEPRAZOLE 40 MG PO CPDR: DELAYED_RELEASE_CAPSULE | ORAL | 3 refills | Status: DC

## 2022-03-27 NOTE — Patient Instructions (Addendum)
We have sent the following medications to your pharmacy for you to pick up at your convenience: Omeprazole   You have been scheduled for an endoscopy. Please follow written instructions given to you at your visit today. If you use inhalers (even only as needed), please bring them with you on the day of your procedure.  Continue Omeprazole 40 mg - Take 1 capsule by mouth twice daily.   Please see Jerrye Bushy sheet and Gerd Diet.   _______________________________________________________  If your blood pressure at your visit was 140/90 or greater, please contact your primary care physician to follow up on this.  _______________________________________________________  If you are age 58 or older, your body mass index should be between 23-30. Your Body mass index is 28.17 kg/m. If this is out of the aforementioned range listed, please consider follow up with your Primary Care Provider.  If you are age 76 or younger, your body mass index should be between 19-25. Your Body mass index is 28.17 kg/m. If this is out of the aformentioned range listed, please consider follow up with your Primary Care Provider.   ________________________________________________________  The [facility] providers would like to encourage you to use [facility] to communicate with providers for non-urgent requests or questions.  Due to long hold times on the telephone, sending your provider a message by [facility] may be a faster and more efficient way to get a response.  Please allow 48 business hours for a response.  Please remember that this is for non-urgent requests.  _______________________________________________________  Thank you for choosing me and [facility].  Amy Esterwood PA-C

## 2022-03-27 NOTE — Progress Notes (Signed)
Subjective:    Patient ID: Brandon Barnes, male    DOB: 08/09/1970, 52 y.o.   MRN: [id number]  HPI  Brandon Barnes is a pleasant 52 year old white male, patient of Dr. Ardis Hughs who was last seen in the office in 2018.  He comes in today with complaints of ongoing issues with heartburn and indigestion and episodes of dysphagia with sensation of food "hanging up" in the esophagus which had been cour occurring at least 3-4 times weekly until he was started on omeprazole 40 mg p.o. twice daily about 4 weeks ago by his PCP Ed Saguier PAC. Prior to that time he had been taking over-the-counter omeprazole 20 mg daily most days.  He says that would help a little bit but if he was bad about his eating habits or under stress then he would usually have an increase in symptoms and require multiple times per day.  Since being on twice daily omeprazole he says his symptoms have significantly improved and he has only had 1 episode in the past couple of weeks of food getting stuck in his esophagus requiring him to stop eating, get up from the table walk around and jump etc. and drink water in order to try to get the food to traverse to the stomach. Given this it had been happening at least 3-4 times weekly prior to getting on twice daily PPI over the past month.  Reviewing his record he did have EGD in June 2018 which showed a small hiatal hernia and there were mucosal changes noted including some longitudinal furrowing in the mid and distal esophagus and edema at the GE junction.  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 Plan was for him to take high-dose PPI for 2 months and then have repeat EGD to confirm whether the biopsy changes were secondary to severe acid reflux versus eosinophilic esophagitis.  Patient did not  return for follow-up.  He also had colonoscopy in June 2018 done for rectal bleeding and this was a normal exam and is indicated for 10-year interval follow-up.  Review of Systems Pertinent positive and negative review of systems were noted in the above HPI section.  All other review of systems was otherwise negative.   Outpatient Encounter Medications as of 03/27/2022  Medication Sig   [DISCONTINUED] omeprazole (PRILOSEC) 40 MG capsule 1 tab po bid   omeprazole (PRILOSEC) 40 MG capsule 1 tab po bid   [DISCONTINUED] Iron, Ferrous Sulfate, 325 (65 Fe) MG TABS Take 325 mg by mouth daily.   [DISCONTINUED] omeprazole (PRILOSEC) 40 MG capsule TAKE 1 CAPSULE BY MOUTH EVERY DAY   Facility-Administered Encounter Medications as of 03/27/2022  Medication   0.9 %  sodium chloride infusion   Allergies  Allergen Reactions   Amoxicillin Rash   Penicillins Rash   Patient Active Problem List   Diagnosis Date Noted   GERD (gastroesophageal reflux disease) 08/13/2016   Cholelithiasis with cholecystitis 06/02/2013   Social History   Socioeconomic History   Marital status: Married    Spouse name: Not on file   Number of children: 3   Years of education: Not on file   Highest education level: Not on file  Occupational History   Occupation: clergy  Tobacco Use   Smoking status: Never   Smokeless tobacco: Never  Vaping Use   Vaping Use: Never used  Substance and Sexual Activity   Alcohol use: Yes    Comment: maybe 2 beers a month   Drug use: No   Sexual activity: Yes  Other Topics Concern   Not on file  Social History Narrative   Not on file   Social Determinants of Health   Financial Resource Strain: Not on file  Food Insecurity: Not on file  Transportation Needs: Not on file  Physical Activity: Not on file  Stress: Not on file  Social Connections: Not on file  Intimate Partner Violence: Not on file    Brandon Barnes family history includes Crohn's disease in his maternal  grandmother; Other in his brother; Peptic Ulcer in his father.      Objective:    Vitals:   03/27/22 1405  BP: 128/86  Pulse: 65  SpO2: 97%    Physical Exam Well-developed well-nourished WM  in no acute distress.  Height, Weight,202 BMI 28.17  HEENT; nontraumatic normocephalic, EOMI, PE R LA, sclera anicteric. Oropharynx; not examined today Neck; supple, no JVD Cardiovascular; regular rate and rhythm with S1-S2, no murmur rub or gallop Pulmonary; Clear bilaterally Abdomen; soft, nontender, nondistended, no palpable mass or hepatosplenomegaly, bowel sounds are active Rectal; not done today Skin; benign exam, no jaundice rash or appreciable lesions Extremities; no clubbing cyanosis or edema skin warm and dry Neuro/Psych; alert and oriented x4, grossly nonfocal mood and affect appropriate        Assessment & Plan:   #59 52 year old male with long history of chronic GERD symptoms with heartburn and indigestion, and new symptoms of intermittent solid food dysphagia which had been occurring at least 3-4 times weekly over the past multiple months.  No episodes requiring regurgitation but does have to stop eating, get up from the table walk around and jump and drink liquid in order to get food to traverse.  He had been on an OTC PPI at that time, has been started on twice daily 40 mg omeprazole over the past month per primary care and has had significant improvement in symptoms but still having intermittent dysphagia though much less frequent.  Prior EGD here in 2018 was concerning for eosinophilic esophagitis with biopsies from the proximal and distal esophagus.  At that time plan was for high-dose PPI for a 27-month course with repeat EGD to rebiopsy to try to confirm diagnosis of EOE versus severe reflux. Patient has not been seen since 2018.  We will need to confirm whether symptoms are secondary to severe GERD versus underlying eosinophilic esophagitis.  I suspect he does have a  stricture, and likely will require dilation.  #2 colon cancer screening-up-to-date with normal colonoscopy 2018, average risk no family history and indicated for 10-year interval follow-up.  #3 General good health  Plan; Continue omeprazole 40 mg p.o. twice daily AC, for at least 1 more month. Patient will be scheduled for EGD with possible esophageal dilation with Dr. Silverio Decamp (in Dr. Eugenia Pancoast absence) which will purposely be scheduled at the end of February to allow 2 months of high-dose PPI before rebiopsying. We discussed a strict antireflux regimen and antireflux diet Refill sent for omeprazole. Plan follow-up colonoscopy June 2028  Lakea Mittelman Genia Harold PA-C 03/27/2022   Cc: Mackie Pai, PA-C

## 2022-04-24 ENCOUNTER — Encounter: Payer: Self-pay | Admitting: Certified Registered Nurse Anesthetist

## 2022-04-25 ENCOUNTER — Ambulatory Visit (AMBULATORY_SURGERY_CENTER): Payer: BC Managed Care – PPO | Admitting: Gastroenterology

## 2022-04-25 ENCOUNTER — Encounter: Payer: Self-pay | Admitting: Gastroenterology

## 2022-04-25 VITALS — BP 124/67 | HR 73 | Temp 96.9°F | Resp 18 | Ht 71.0 in | Wt 202.0 lb

## 2022-04-25 DIAGNOSIS — R1310 Dysphagia, unspecified: Secondary | ICD-10-CM

## 2022-04-25 DIAGNOSIS — K2970 Gastritis, unspecified, without bleeding: Secondary | ICD-10-CM | POA: Diagnosis not present

## 2022-04-25 DIAGNOSIS — K2950 Unspecified chronic gastritis without bleeding: Secondary | ICD-10-CM | POA: Diagnosis not present

## 2022-04-25 MED ORDER — SODIUM CHLORIDE 0.9 % IV SOLN: 500.0000 mL | Freq: Once | INTRAVENOUS | Status: DC

## 2022-04-25 NOTE — Progress Notes (Signed)
Please refer to office visit note 03/27/22 by Nicoletta Ba. No additional changes in H&P Patient is appropriate for planned procedure(s) and anesthesia in an ambulatory setting  K. Denzil Magnuson , MD [phone]

## 2022-04-25 NOTE — Patient Instructions (Signed)
Please read handouts provided. Continue present medications. Await pathology results. Return to GI office to see Dr. Silverio Decamp.  YOU HAD AN ENDOSCOPIC PROCEDURE TODAY AT THE [facility]:   Refer to the procedure report that was given to you for any specific questions about what was found during the examination.  If the procedure report does not answer your questions, please call your gastroenterologist to clarify.  If you requested that your care partner not be given the details of your procedure findings, then the procedure report has been included in a sealed envelope for you to review at your convenience later.  YOU SHOULD EXPECT: Some feelings of bloating in the abdomen. Passage of more gas than usual.  Walking can help get rid of the air that was put into your GI tract during the procedure and reduce the bloating. If you had a lower endoscopy (such as a colonoscopy or flexible sigmoidoscopy) you may notice spotting of blood in your stool or on the toilet paper. If you underwent a bowel prep for your procedure, you may not have a normal bowel movement for a few days.  Please Note:  You might notice some irritation and congestion in your nose or some drainage.  This is from the oxygen used during your procedure.  There is no need for concern and it should clear up in a day or so.  SYMPTOMS TO REPORT IMMEDIATELY:  Following upper endoscopy (EGD)  Vomiting of blood or coffee ground material  New chest pain or pain under the shoulder blades  Painful or persistently difficult swallowing  New shortness of breath  Fever of 100F or higher  Black, tarry-looking stools  For urgent or emergent issues, a gastroenterologist can be reached at any hour by calling [phone]. Do not use MyChart messaging for urgent concerns.    DIET:  We do recommend a small meal at first, but then you may proceed to your regular diet.  Drink plenty of fluids but you should avoid alcoholic beverages for  24 hours.  ACTIVITY:  You should plan to take it easy for the rest of today and you should NOT DRIVE or use heavy machinery until tomorrow (because of the sedation medicines used during the test).    FOLLOW UP: Our staff will call the number listed on your records the next business day following your procedure.  We will call around 7:15- 8:00 am to check on you and address any questions or concerns that you may have regarding the information given to you following your procedure. If we do not reach you, we will leave a message.     If any biopsies were taken you will be contacted by phone or by letter within the next 1-3 weeks.  Please call us at [phone] if you have not heard about the biopsies in 3 weeks.    SIGNATURES/CONFIDENTIALITY: You and/or your care partner have signed paperwork which will be entered into your electronic medical record.  These signatures attest to the fact that that the information above on your After Visit Summary has been reviewed and is understood.  Full responsibility of the confidentiality of this discharge information lies with you and/or your care-partner.

## 2022-04-25 NOTE — Progress Notes (Signed)
1040 HR > 100 with esmolol 25 mg given IV, MD updated, vss

## 2022-04-25 NOTE — Progress Notes (Signed)
Report given to PACU, vss 

## 2022-04-25 NOTE — Progress Notes (Signed)
1024 Robinul 0.1 mg IV given due large amount of secretions upon assessment.  MD made aware, vss

## 2022-04-25 NOTE — Progress Notes (Signed)
Called to room to assist during endoscopic procedure.  Patient ID and intended procedure confirmed with present staff. Received instructions for my participation in the procedure from the performing physician.  

## 2022-04-25 NOTE — Progress Notes (Signed)
Reviewed and agree with documentation and assessment and plan. K. Veena Ida Milbrath , MD   

## 2022-04-25 NOTE — Op Note (Signed)
[facility] Patient Name: Brandon Barnes Procedure Date: 04/25/2022 10:11 AM MRN: [id number] Endoscopist: Mauri Pole , MD, [id number] Age: 52 Referring MD:  Date of Birth: 03/12/1970 Gender: Male Account #: [id number] Procedure:                Upper GI endoscopy Indications:              Dysphagia, Esophageal reflux symptoms that persist                            despite appropriate therapy Medicines:                Monitored Anesthesia Care Procedure:                Pre-Anesthesia Assessment:                           - Prior to the procedure, a History and Physical                            was performed, and patient medications and                            allergies were reviewed. The patient's tolerance of                            previous anesthesia was also reviewed. The risks                            and benefits of the procedure and the sedation                            options and risks were discussed with the patient.                            All questions were answered, and informed consent                            was obtained. Prior Anticoagulants: The patient has                            taken no anticoagulant or antiplatelet agents. ASA                            Grade Assessment: II - A patient with mild systemic                            disease. After reviewing the risks and benefits,                            the patient was deemed in satisfactory condition to                            undergo the procedure.  After obtaining informed consent, the endoscope was                            passed under direct vision. Throughout the                            procedure, the patient's blood pressure, pulse, and                            oxygen saturations were monitored continuously. The                            GIF [id number] was introduced through the                            mouth, and advanced to the  second part of duodenum.                            The upper GI endoscopy was accomplished without                            difficulty. The patient tolerated the procedure                            well. Scope In: Scope Out: Findings:                 The Z-line was regular and was found 38 cm from the                            incisors.                           The gastroesophageal flap valve was visualized                            endoscopically and classified as Hill Grade III                            (minimal fold, loose to endoscope, hiatal hernia                            likely).                           No endoscopic abnormality was evident in the                            esophagus to explain the patient's complaint of                            dysphagia.                           Patchy mild inflammation characterized by  congestion (edema) and erythema was found in the                            entire examined stomach. Biopsies were taken with a                            cold forceps for Helicobacter pylori testing.                           The cardia and gastric fundus were normal on                            retroflexion.                           The examined duodenum was normal. Complications:            No immediate complications. Estimated Blood Loss:     Estimated blood loss was minimal. Impression:               - Z-line regular, 38 cm from the incisors.                           - Gastroesophageal flap valve classified as Hill                            Grade III (minimal fold, loose to endoscope, hiatal                            hernia likely).                           - No endoscopic esophageal abnormality to explain                            patient's dysphagia.                           - Gastritis. Biopsied.                           - Normal examined duodenum. Recommendation:           - Patient has a contact  number available for                            emergencies. The signs and symptoms of potential                            delayed complications were discussed with the                            patient. Return to normal activities tomorrow.                            Written discharge instructions were provided to the  patient.                           - Resume previous diet.                           - Continue present medications.                           - Await pathology results.                           - Obtain Barium esophagogram with tablet                           - Return to GI office at the next available                            appointment. Mauri Pole, MD 04/25/2022 10:49:22 AM This report has been signed electronically.

## 2022-04-26 ENCOUNTER — Telehealth: Payer: Self-pay

## 2022-04-26 NOTE — Telephone Encounter (Signed)
Left message on follow up call. 

## 2022-05-09 ENCOUNTER — Encounter: Payer: Self-pay | Admitting: Gastroenterology

## 2022-08-05 ENCOUNTER — Other Ambulatory Visit: Payer: Self-pay | Admitting: Physician Assistant

## 2022-11-25 ENCOUNTER — Ambulatory Visit (INDEPENDENT_AMBULATORY_CARE_PROVIDER_SITE_OTHER): Payer: BC Managed Care – PPO | Admitting: Medical

## 2022-11-25 VITALS — BP 128/86 | HR 63 | Temp 98.0°F | Resp 16 | Ht 71.0 in | Wt 190.4 lb

## 2022-11-25 DIAGNOSIS — Z23 Encounter for immunization: Secondary | ICD-10-CM | POA: Diagnosis not present

## 2022-11-25 DIAGNOSIS — Z125 Encounter for screening for malignant neoplasm of prostate: Secondary | ICD-10-CM | POA: Diagnosis not present

## 2022-11-25 DIAGNOSIS — D649 Anemia, unspecified: Secondary | ICD-10-CM | POA: Diagnosis not present

## 2022-11-25 DIAGNOSIS — Z0000 Encounter for general adult medical examination without abnormal findings: Secondary | ICD-10-CM

## 2022-11-25 LAB — LIPID PANEL
Cholesterol: 152 mg/dL (ref 0–200)
HDL: 41.1 mg/dL (ref >39.00)
LDL Cholesterol: 96 mg/dL (ref 0–99)
NonHDL: 111.07
Total CHOL/HDL Ratio: 4
Triglycerides: 74 mg/dL (ref 0.0–149.0)
VLDL: 14.8 mg/dL (ref 0.0–40.0)

## 2022-11-25 LAB — CBC WITH DIFFERENTIAL/PLATELET
Basophils Absolute: 0.1 10*3/uL (ref 0.0–0.1)
Basophils Relative: 1.3 % (ref 0.0–3.0)
Eosinophils Absolute: 0.6 10*3/uL (ref 0.0–0.7)
Eosinophils Relative: 9.7 % — ABNORMAL HIGH (ref 0.0–5.0)
HCT: 37.3 % — ABNORMAL LOW (ref 39.0–52.0)
Hemoglobin: 11.6 g/dL — ABNORMAL LOW (ref 13.0–17.0)
Lymphocytes Relative: 25.7 % (ref 12.0–46.0)
Lymphs Abs: 1.7 10*3/uL (ref 0.7–4.0)
MCHC: 31.2 g/dL (ref 30.0–36.0)
MCV: 71.8 fL — ABNORMAL LOW (ref 78.0–100.0)
Monocytes Absolute: 0.5 10*3/uL (ref 0.1–1.0)
Monocytes Relative: 7.3 % (ref 3.0–12.0)
Neutro Abs: 3.7 10*3/uL (ref 1.4–7.7)
Neutrophils Relative %: 56 % (ref 43.0–77.0)
Platelets: 286 10*3/uL (ref 150.0–400.0)
RBC: 5.19 Mil/uL (ref 4.22–5.81)
RDW: 17.1 % — ABNORMAL HIGH (ref 11.5–15.5)
WBC: 6.5 10*3/uL (ref 4.0–10.5)

## 2022-11-25 LAB — COMPREHENSIVE METABOLIC PANEL
ALT: 12 U/L (ref 0–53)
AST: 16 U/L (ref 0–37)
Albumin: 4.1 g/dL (ref 3.5–5.2)
Alkaline Phosphatase: 69 U/L (ref 39–117)
BUN: 13 mg/dL (ref 6–23)
CO2: 27 meq/L (ref 19–32)
Calcium: 8.8 mg/dL (ref 8.4–10.5)
Chloride: 107 meq/L (ref 96–112)
Creatinine, Ser: 1.15 mg/dL (ref 0.40–1.50)
GFR: 73.93 mL/min (ref >60.00)
Glucose, Bld: 89 mg/dL (ref 70–99)
Potassium: 4.3 meq/L (ref 3.5–5.1)
Sodium: 141 meq/L (ref 135–145)
Total Bilirubin: 1 mg/dL (ref 0.2–1.2)
Total Protein: 6.7 g/dL (ref 6.0–8.3)

## 2022-11-25 LAB — PSA: PSA: 0.51 ng/mL (ref 0.10–4.00)

## 2022-11-25 NOTE — Addendum Note (Signed)
Addended by: Roxanne Gates on: 11/25/2022 08:43 AM   Modules accepted: Orders

## 2022-11-25 NOTE — Progress Notes (Signed)
Subjective:    Patient ID: Brandon Barnes, male    DOB: Mar 25, 1970, 52 y.o.   MRN: [id number]  HPI  Here for wellness exam. Pt is fasting.  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  Pt eating healthier recently but little more. Married. 3 children. Non smoker and no alcohol.    Pt states will get flu vaccine.  Discussed shingles vaccine and will get as well. He states declined covid.  Genella Rife- still well controlled unless he eats unhealthy. Just use once a day. If eats very unhealthy will need twice daily. He states declined repeat egd due to cost.  On review colonoscopy is to be repeated in 10 years.      Review of Systems  Constitutional:  Negative for chills, fatigue and fever.  HENT:  Negative for congestion, drooling, nosebleeds and postnasal drip.   Respiratory:  Negative for cough, chest tightness, shortness of breath and wheezing.   Cardiovascular:  Negative for chest pain and palpitations.  Gastrointestinal:  Negative for abdominal pain, constipation, diarrhea and nausea.  Genitourinary:  Negative for dysuria, frequency and penile discharge.  Musculoskeletal:  Negative for back pain, myalgias and neck stiffness.  Skin:  Negative for rash.  Neurological:  Negative for dizziness, seizures, weakness and light-headedness.  Hematological:  Negative for adenopathy. Does not bruise/bleed easily.  Psychiatric/Behavioral:  Negative for behavioral problems and decreased concentration. The patient is not nervous/anxious.     Past Medical History:  Diagnosis Date   Allergy    Anxiety    Depression    Gallstones    abdominal pain and nausea    GERD (gastroesophageal reflux disease)    rare occasional.     Social History   Socioeconomic History   Marital status: Married    Spouse name: Not on file   Number of children: 3   Years of education: Not on file   Highest education level: Not on file  Occupational History   Occupation: clergy   Tobacco Use   Smoking status: Never   Smokeless tobacco: Never  Vaping Use   Vaping status: Never Used  Substance and Sexual Activity   Alcohol use: Yes    Comment: maybe 2 beers a month   Drug use: No   Sexual activity: Yes  Other Topics Concern   Not on file  Social History Narrative   Not on file   Social Determinants of Health   Financial Resource Strain: Not on file  Food Insecurity: Not on file  Transportation Needs: Not on file  Physical Activity: Not on file  Stress: Not on file  Social Connections: Not on file  Intimate Partner Violence: Not on file    Past Surgical History:  Procedure Laterality Date   CHOLECYSTECTOMY N/A 06/04/2013   Procedure: LAPAROSCOPIC CHOLECYSTECTOMY WITH INTRAOPERATIVE CHOLANGIOGRAM;  Surgeon: Velora Heckler, MD;  Location: [facility];  Service: General;  Laterality: N/A;   vascectomy     WISDOM TOOTH EXTRACTION      Family History  Problem Relation Age of Onset   Peptic Ulcer Father    Other Brother        esophageal stricture with dilation   Crohn's disease Maternal Grandmother    Colon cancer Neg Hx    Rectal cancer Neg Hx    Stomach cancer Neg Hx    Pancreatic cancer Neg Hx    Liver cancer Neg Hx     Allergies  Allergen Reactions  Amoxicillin Rash   Penicillins Rash    Current Outpatient Medications on File Prior to Visit  Medication Sig Dispense Refill   cetirizine (ZYRTEC) 10 MG tablet Take 10 mg by mouth daily.     omeprazole (PRILOSEC) 40 MG capsule TAKE 1 CAPSULE BY MOUTH TWICE A DAY 180 capsule 1   No current facility-administered medications on file prior to visit.    BP 128/86 (BP Location: Left Arm, Patient Position: Sitting, Cuff Size: Normal)   Pulse 63   Temp 98 F (36.7 C) (Oral)   Resp 16   Ht 5\' 11"  (1.803 m)   Wt 190 lb 6.4 oz (86.4 kg)   SpO2 97%   BMI 26.56 kg/m        Objective:   Physical Exam   General Mental Status- Alert. General Appearance- Not in acute distress.   Skin No  obvoius worrisome moles/lesion.(Followed by derm regularly)  Neck Carotid Arteries- Normal color. Moisture- Normal Moisture. No carotid bruits. No JVD.  Chest and Lung Exam Auscultation: Breath Sounds:-Normal.  Cardiovascular Auscultation:Rythm- Regular. Murmurs & Other Heart Sounds:Auscultation of the heart reveals- No Murmurs.  Abdomen Inspection:-Inspeection Normal. Palpation/Percussion:Note:No mass. Palpation and Percussion of the abdomen reveal- Non Tender, Non Distended + BS, no rebound or guarding.   Neurologic Cranial Nerve exam:- CN III-XII intact(No nystagmus), symmetric smile. Strength:- 5/5 equal and symmetric strength both upper and lower extremities.      Assessment & Plan:   For you wellness exam today I have ordered cbc, psa, cmp and lipid panel.  Vaccine flu and shingrix vaccine. Schedule second shingrix in 2 months.  Recommend exercise and healthy diet.  We will let you know lab results as they come in.  Follow up date appointment will be determined after lab review.    Genella Rife- controlled with diet and omeprazole.  Esperanza Richters, PA-C

## 2022-11-25 NOTE — Patient Instructions (Addendum)
For you wellness exam today I have ordered cbc, psa, cmp and lipid panel.  Vaccine flu and shingrix vaccine. Schedule second shingrix in 2 months.  Recommend exercise and healthy diet.  We will let you know lab results as they come in.  Follow up date appointment will be determined after lab review.    Brandon Barnes- controlled with diet and omeprazole.   Preventive Care 3-51 Years Old, Male Preventive care refers to lifestyle choices and visits with your health care provider that can promote health and wellness. Preventive care visits are also called wellness exams. What can I expect for my preventive care visit? Counseling During your preventive care visit, your health care provider may ask about your: Medical history, including: Past medical problems. Family medical history. Current health, including: Emotional well-being. Home life and relationship well-being. Sexual activity. Lifestyle, including: Alcohol, nicotine or tobacco, and drug use. Access to firearms. Diet, exercise, and sleep habits. Safety issues such as seatbelt and bike helmet use. Sunscreen use. Work and work Astronomer. Physical exam Your health care provider will check your: Height and weight. These may be used to calculate your BMI (body mass index). BMI is a measurement that tells if you are at a healthy weight. Waist circumference. This measures the distance around your waistline. This measurement also tells if you are at a healthy weight and may help predict your risk of certain diseases, such as type 2 diabetes and high blood pressure. Heart rate and blood pressure. Body temperature. Skin for abnormal spots. What immunizations do I need?  Vaccines are usually given at various ages, according to a schedule. Your health care provider will recommend vaccines for you based on your age, medical history, and lifestyle or other factors, such as travel or where you work. What tests do I need? Screening Your  health care provider may recommend screening tests for certain conditions. This may include: Lipid and cholesterol levels. Diabetes screening. This is done by checking your blood sugar (glucose) after you have not eaten for a while (fasting). Hepatitis B test. Hepatitis C test. HIV (human immunodeficiency virus) test. STI (sexually transmitted infection) testing, if you are at risk. Lung cancer screening. Prostate cancer screening. Colorectal cancer screening. Talk with your health care provider about your test results, treatment options, and if necessary, the need for more tests. Follow these instructions at home: Eating and drinking  Eat a diet that includes fresh fruits and vegetables, whole grains, lean protein, and low-fat dairy products. Take vitamin and mineral supplements as recommended by your health care provider. Do not drink alcohol if your health care provider tells you not to drink. If you drink alcohol: Limit how much you have to 0-2 drinks a day. Know how much alcohol is in your drink. In the [place] one drink equals one 12 oz bottle of beer (355 mL), one 5 oz glass of wine (148 mL), or one 1 oz glass of hard liquor (44 mL). Lifestyle Brush your teeth every morning and night with fluoride toothpaste. Floss one time each day. Exercise for at least 30 minutes 5 or more days each week. Do not use any products that contain nicotine or tobacco. These products include cigarettes, chewing tobacco, and vaping devices, such as e-cigarettes. If you need help quitting, ask your health care provider. Do not use drugs. If you are sexually active, practice safe sex. Use a condom or other form of protection to prevent STIs. Take aspirin only as told by your health care provider. Make sure  that you understand how much to take and what form to take. Work with your health care provider to find out whether it is safe and beneficial for you to take aspirin daily. Find healthy ways to manage  stress, such as: Meditation, yoga, or listening to music. Journaling. Talking to a trusted person. Spending time with friends and family. Minimize exposure to UV radiation to reduce your risk of skin cancer. Safety Always wear your seat belt while driving or riding in a vehicle. Do not drive: If you have been drinking alcohol. Do not ride with someone who has been drinking. When you are tired or distracted. While texting. If you have been using any mind-altering substances or drugs. Wear a helmet and other protective equipment during sports activities. If you have firearms in your house, make sure you follow all gun safety procedures. What's next? Go to your health care provider once a year for an annual wellness visit. Ask your health care provider how often you should have your eyes and teeth checked. Stay up to date on all vaccines. This information is not intended to replace advice given to you by your health care provider. Make sure you discuss any questions you have with your health care provider. Document Revised: 08/09/2020 Document Reviewed: 08/09/2020 Elsevier Patient Education  2024 [employer].

## 2022-11-27 ENCOUNTER — Encounter: Payer: Self-pay | Admitting: Medical

## 2022-11-27 ENCOUNTER — Ambulatory Visit (INDEPENDENT_AMBULATORY_CARE_PROVIDER_SITE_OTHER): Payer: BC Managed Care – PPO

## 2022-11-27 DIAGNOSIS — D649 Anemia, unspecified: Secondary | ICD-10-CM

## 2022-11-27 LAB — IBC + FERRITIN
Ferritin: 6.1 ng/mL — ABNORMAL LOW (ref 22.0–322.0)
Iron: 25 ug/dL — ABNORMAL LOW (ref 42–165)
Saturation Ratios: 6.1 % — ABNORMAL LOW (ref 20.0–50.0)
TIBC: 407.4 ug/dL (ref 250.0–450.0)
Transferrin: 291 mg/dL (ref 212.0–360.0)

## 2022-11-27 MED ORDER — IRON (FERROUS SULFATE) 325 (65 FE) MG PO TABS: 325.0000 mg | ORAL_TABLET | Freq: Every day | ORAL | 3 refills | Status: AC

## 2022-11-27 NOTE — Addendum Note (Signed)
Addended by: Gwenevere Abbot on: 11/27/2022 10:26 PM   Modules accepted: Orders

## 2022-11-27 NOTE — Addendum Note (Signed)
Addended by: Gwenevere Abbot on: 11/27/2022 06:43 AM   Modules accepted: Orders

## 2022-11-27 NOTE — Addendum Note (Signed)
Addended by: Mervin Kung A on: 11/27/2022 08:04 AM   Modules accepted: Orders

## 2023-01-28 ENCOUNTER — Ambulatory Visit (INDEPENDENT_AMBULATORY_CARE_PROVIDER_SITE_OTHER): Payer: BC Managed Care – PPO

## 2023-01-28 DIAGNOSIS — Z23 Encounter for immunization: Secondary | ICD-10-CM

## 2023-01-28 NOTE — Progress Notes (Signed)
Brandon Barnes is a 52 y.o. male presents to the office today for Shingrix #2 injection, per physician's orders. Original order: 11/15/2022 Shingrix 0.5 mL IM was administered L Deltoid today. Patient tolerated injection. Patient due for follow up labs/provider appt: No.  Patient next injection due: n/a- pt has completed series.  Creft, Feliberto Harts

## 2023-02-02 ENCOUNTER — Other Ambulatory Visit: Payer: Self-pay | Admitting: Physician Assistant

## 2023-04-21 DIAGNOSIS — F411 Generalized anxiety disorder: Secondary | ICD-10-CM | POA: Diagnosis not present

## 2023-04-28 DIAGNOSIS — F411 Generalized anxiety disorder: Secondary | ICD-10-CM | POA: Diagnosis not present

## 2023-05-05 DIAGNOSIS — F411 Generalized anxiety disorder: Secondary | ICD-10-CM | POA: Diagnosis not present

## 2023-05-12 DIAGNOSIS — F411 Generalized anxiety disorder: Secondary | ICD-10-CM | POA: Diagnosis not present

## 2023-05-19 DIAGNOSIS — F411 Generalized anxiety disorder: Secondary | ICD-10-CM | POA: Diagnosis not present

## 2023-05-26 DIAGNOSIS — F411 Generalized anxiety disorder: Secondary | ICD-10-CM | POA: Diagnosis not present

## 2023-06-02 DIAGNOSIS — F411 Generalized anxiety disorder: Secondary | ICD-10-CM | POA: Diagnosis not present

## 2023-06-09 DIAGNOSIS — F411 Generalized anxiety disorder: Secondary | ICD-10-CM | POA: Diagnosis not present

## 2023-06-16 DIAGNOSIS — F411 Generalized anxiety disorder: Secondary | ICD-10-CM | POA: Diagnosis not present

## 2023-06-30 DIAGNOSIS — F411 Generalized anxiety disorder: Secondary | ICD-10-CM | POA: Diagnosis not present

## 2023-07-14 DIAGNOSIS — F411 Generalized anxiety disorder: Secondary | ICD-10-CM | POA: Diagnosis not present

## 2023-07-28 DIAGNOSIS — F411 Generalized anxiety disorder: Secondary | ICD-10-CM | POA: Diagnosis not present

## 2023-08-18 DIAGNOSIS — F411 Generalized anxiety disorder: Secondary | ICD-10-CM | POA: Diagnosis not present

## 2023-09-01 DIAGNOSIS — F411 Generalized anxiety disorder: Secondary | ICD-10-CM | POA: Diagnosis not present

## 2023-10-15 DIAGNOSIS — F411 Generalized anxiety disorder: Secondary | ICD-10-CM | POA: Diagnosis not present

## 2023-10-29 DIAGNOSIS — F411 Generalized anxiety disorder: Secondary | ICD-10-CM | POA: Diagnosis not present

## 2023-11-12 DIAGNOSIS — F411 Generalized anxiety disorder: Secondary | ICD-10-CM | POA: Diagnosis not present

## 2023-12-10 DIAGNOSIS — F411 Generalized anxiety disorder: Secondary | ICD-10-CM | POA: Diagnosis not present

## 2023-12-24 DIAGNOSIS — F411 Generalized anxiety disorder: Secondary | ICD-10-CM | POA: Diagnosis not present

## 2024-01-07 DIAGNOSIS — F411 Generalized anxiety disorder: Secondary | ICD-10-CM | POA: Diagnosis not present

## 2024-01-20 DIAGNOSIS — Z1283 Encounter for screening for malignant neoplasm of skin: Secondary | ICD-10-CM | POA: Diagnosis not present

## 2024-01-20 DIAGNOSIS — D225 Melanocytic nevi of trunk: Secondary | ICD-10-CM | POA: Diagnosis not present

## 2024-01-21 DIAGNOSIS — F411 Generalized anxiety disorder: Secondary | ICD-10-CM | POA: Diagnosis not present

## 2024-02-10 DIAGNOSIS — F411 Generalized anxiety disorder: Secondary | ICD-10-CM | POA: Diagnosis not present
